# Patient Record
Sex: Female | Born: 1965 | Race: White | Hispanic: No | Marital: Married | Smoking: Never smoker
Health system: Southern US, Community
[De-identification: ages and names within clinical notes are randomized; demographics above are authoritative.]

## PROBLEM LIST (undated history)

## (undated) DIAGNOSIS — E039 Hypothyroidism, unspecified: Secondary | ICD-10-CM

## (undated) DIAGNOSIS — E079 Disorder of thyroid, unspecified: Secondary | ICD-10-CM

## (undated) DIAGNOSIS — F419 Anxiety disorder, unspecified: Secondary | ICD-10-CM

## (undated) HISTORY — PX: CHOLECYSTECTOMY: SHX55

## (undated) HISTORY — DX: Disorder of thyroid, unspecified: E07.9

## (undated) HISTORY — PX: THYROIDECTOMY, PARTIAL: SHX18

## (undated) HISTORY — DX: Anxiety disorder, unspecified: F41.9

---

## 2015-05-20 LAB — HM MAMMOGRAPHY

## 2015-12-09 DIAGNOSIS — G4700 Insomnia, unspecified: Secondary | ICD-10-CM | POA: Diagnosis not present

## 2015-12-09 DIAGNOSIS — Z1211 Encounter for screening for malignant neoplasm of colon: Secondary | ICD-10-CM | POA: Diagnosis not present

## 2015-12-09 DIAGNOSIS — F419 Anxiety disorder, unspecified: Secondary | ICD-10-CM | POA: Diagnosis not present

## 2015-12-09 DIAGNOSIS — E039 Hypothyroidism, unspecified: Secondary | ICD-10-CM | POA: Diagnosis not present

## 2015-12-20 DIAGNOSIS — M25511 Pain in right shoulder: Secondary | ICD-10-CM | POA: Diagnosis not present

## 2015-12-20 DIAGNOSIS — G8929 Other chronic pain: Secondary | ICD-10-CM | POA: Diagnosis not present

## 2015-12-20 DIAGNOSIS — Z6831 Body mass index (BMI) 31.0-31.9, adult: Secondary | ICD-10-CM | POA: Diagnosis not present

## 2015-12-26 DIAGNOSIS — M7541 Impingement syndrome of right shoulder: Secondary | ICD-10-CM | POA: Diagnosis not present

## 2016-01-20 DIAGNOSIS — M7541 Impingement syndrome of right shoulder: Secondary | ICD-10-CM | POA: Diagnosis not present

## 2016-01-20 DIAGNOSIS — M25511 Pain in right shoulder: Secondary | ICD-10-CM | POA: Diagnosis not present

## 2016-01-27 DIAGNOSIS — M25611 Stiffness of right shoulder, not elsewhere classified: Secondary | ICD-10-CM | POA: Diagnosis not present

## 2016-01-27 DIAGNOSIS — M25511 Pain in right shoulder: Secondary | ICD-10-CM | POA: Diagnosis not present

## 2016-01-30 DIAGNOSIS — M7541 Impingement syndrome of right shoulder: Secondary | ICD-10-CM | POA: Diagnosis not present

## 2016-09-16 DIAGNOSIS — M71572 Other bursitis, not elsewhere classified, left ankle and foot: Secondary | ICD-10-CM | POA: Diagnosis not present

## 2016-09-16 DIAGNOSIS — M76822 Posterior tibial tendinitis, left leg: Secondary | ICD-10-CM | POA: Diagnosis not present

## 2016-09-16 DIAGNOSIS — M71571 Other bursitis, not elsewhere classified, right ankle and foot: Secondary | ICD-10-CM | POA: Diagnosis not present

## 2016-09-16 DIAGNOSIS — M722 Plantar fascial fibromatosis: Secondary | ICD-10-CM | POA: Diagnosis not present

## 2016-09-16 DIAGNOSIS — M76821 Posterior tibial tendinitis, right leg: Secondary | ICD-10-CM | POA: Diagnosis not present

## 2016-09-16 DIAGNOSIS — M7731 Calcaneal spur, right foot: Secondary | ICD-10-CM | POA: Diagnosis not present

## 2016-09-16 DIAGNOSIS — M7732 Calcaneal spur, left foot: Secondary | ICD-10-CM | POA: Diagnosis not present

## 2016-09-22 DIAGNOSIS — M71572 Other bursitis, not elsewhere classified, left ankle and foot: Secondary | ICD-10-CM | POA: Diagnosis not present

## 2016-09-22 DIAGNOSIS — M722 Plantar fascial fibromatosis: Secondary | ICD-10-CM | POA: Diagnosis not present

## 2016-09-22 DIAGNOSIS — M71571 Other bursitis, not elsewhere classified, right ankle and foot: Secondary | ICD-10-CM | POA: Diagnosis not present

## 2016-10-30 DIAGNOSIS — Z0289 Encounter for other administrative examinations: Secondary | ICD-10-CM | POA: Diagnosis not present

## 2016-10-30 DIAGNOSIS — E039 Hypothyroidism, unspecified: Secondary | ICD-10-CM | POA: Diagnosis not present

## 2016-10-30 DIAGNOSIS — Z79891 Long term (current) use of opiate analgesic: Secondary | ICD-10-CM | POA: Diagnosis not present

## 2016-10-30 DIAGNOSIS — F5101 Primary insomnia: Secondary | ICD-10-CM | POA: Diagnosis not present

## 2016-10-30 DIAGNOSIS — Z5181 Encounter for therapeutic drug level monitoring: Secondary | ICD-10-CM | POA: Diagnosis not present

## 2016-10-30 DIAGNOSIS — Z6831 Body mass index (BMI) 31.0-31.9, adult: Secondary | ICD-10-CM | POA: Diagnosis not present

## 2016-10-30 DIAGNOSIS — F419 Anxiety disorder, unspecified: Secondary | ICD-10-CM | POA: Diagnosis not present

## 2016-12-07 ENCOUNTER — Emergency Department
Admission: EM | Admit: 2016-12-07 | Discharge: 2016-12-07 | Disposition: A | Discharge status: Home / Self Care | Payer: BLUE CROSS/BLUE SHIELD | Attending: Emergency Medicine | Admitting: Emergency Medicine

## 2016-12-07 ENCOUNTER — Emergency Department: Payer: BLUE CROSS/BLUE SHIELD

## 2016-12-07 ENCOUNTER — Encounter: Payer: Self-pay | Admitting: *Deleted

## 2016-12-07 DIAGNOSIS — S3992XA Unspecified injury of lower back, initial encounter: Secondary | ICD-10-CM | POA: Diagnosis not present

## 2016-12-07 DIAGNOSIS — S39012A Strain of muscle, fascia and tendon of lower back, initial encounter: Secondary | ICD-10-CM | POA: Diagnosis not present

## 2016-12-07 DIAGNOSIS — Y939 Activity, unspecified: Secondary | ICD-10-CM | POA: Diagnosis not present

## 2016-12-07 DIAGNOSIS — Y999 Unspecified external cause status: Secondary | ICD-10-CM | POA: Diagnosis not present

## 2016-12-07 DIAGNOSIS — W010XXA Fall on same level from slipping, tripping and stumbling without subsequent striking against object, initial encounter: Secondary | ICD-10-CM | POA: Diagnosis not present

## 2016-12-07 DIAGNOSIS — Y929 Unspecified place or not applicable: Secondary | ICD-10-CM | POA: Diagnosis not present

## 2016-12-07 DIAGNOSIS — M545 Low back pain: Secondary | ICD-10-CM | POA: Diagnosis not present

## 2016-12-07 MED ORDER — TRAMADOL HCL 50 MG PO TABS: 50.0000 mg | ORAL_TABLET | Freq: Four times a day (QID) | ORAL | 0 refills | Status: DC | PRN

## 2016-12-07 MED ORDER — HYDROMORPHONE HCL 1 MG/ML IJ SOLN
1.0000 mg | Freq: Once | INTRAMUSCULAR | Status: AC
  Administered 2016-12-07: 1 mg via INTRAMUSCULAR
  Filled 2016-12-07: qty 1

## 2016-12-07 MED ORDER — CYCLOBENZAPRINE HCL 10 MG PO TABS: 10.0000 mg | ORAL_TABLET | Freq: Three times a day (TID) | ORAL | 0 refills | Status: DC | PRN

## 2016-12-07 MED ORDER — ORPHENADRINE CITRATE 30 MG/ML IJ SOLN
60.0000 mg | Freq: Two times a day (BID) | INTRAMUSCULAR | Status: DC
  Administered 2016-12-07: 60 mg via INTRAMUSCULAR
  Filled 2016-12-07: qty 2

## 2016-12-07 NOTE — ED Triage Notes (Signed)
Pt states lower back pain Since Friday, denies any urinary symptoms, states sitting makes the pain worse

## 2016-12-07 NOTE — ED Provider Notes (Signed)
[facility] Emergency Department Provider Note   ____________________________________________   First MD Initiated Contact with Patient 12/07/16 [phone]     (approximate)  I have reviewed the triage vital signs and the nursing notes.   HISTORY  Chief Complaint Back Pain    HPI Elizabeth Fuller is a 51 y.o. female patient complaining of 3 days of low back pain. Patient stated onset she misstepped on the pavement. Patient stated pain was intermittently notably cannabis worsen when she went to work this morning. Patient denies radicular component to her pain. Patient denies bladder or bowel dysfunction..Patient rates the pain as a 10 over 10. Patient had a pain as "achy". Mild relief with over-the-counter anti-inflammatory medications.   History reviewed. No pertinent past medical history.  There are no active problems to display for this patient.   History reviewed. No pertinent surgical history.  Prior to Admission medications   Medication Sig Start Date End Date Taking? Authorizing Provider  cyclobenzaprine (FLEXERIL) 10 MG tablet Take 1 tablet (10 mg total) by mouth 3 (three) times daily as needed. 12/07/16   Sable Feil, PA-C  traMADol (ULTRAM) 50 MG tablet Take 1 tablet (50 mg total) by mouth every 6 (six) hours as needed for moderate pain. 12/07/16   Sable Feil, PA-C    Allergies Patient has no known allergies.  History reviewed. No pertinent family history.  Social History Social History  Substance Use Topics  . Smoking status: Unknown If Ever Smoked  . Smokeless tobacco: Never Used  . Alcohol use No    Review of Systems  Constitutional: No fever/chills Eyes: No visual changes. ENT: No sore throat. Cardiovascular: Denies chest pain. Respiratory: Denies shortness of breath. Gastrointestinal: No abdominal pain.  No nausea, no vomiting.  No diarrhea.  No constipation. Genitourinary: Negative for dysuria. Musculoskeletal: Positive  for back pain. Skin: Negative for rash. Neurological: Negative for headaches, focal weakness or numbness.   ____________________________________________   PHYSICAL EXAM:  VITAL SIGNS: ED Triage Vitals  Enc Vitals Group     BP 12/07/16 0923 (!) 152/119     Pulse Rate 12/07/16 0923 (!) 108     Resp 12/07/16 0923 (!) 26     Temp --      Temp src --      SpO2 12/07/16 0923 100 %     Weight 12/07/16 0924 180 lb (81.6 kg)     Height 12/07/16 0924 5\' 4"  (1.626 m)     Head Circumference --      Peak Flow --      Pain Score 12/07/16 0924 10     Pain Loc --      Pain Edu? --      Excl. in [place]? --     Constitutional: Alert and oriented. Moderate distress Neck: No stridor.  No cervical spine tenderness to palpation. Hematological/Lymphatic/Immunilogical: No cervical lymphadenopathy. Cardiovascular: Normal rate, regular rhythm. Grossly normal heart sounds.  Good peripheral circulation. Respiratory: Normal respiratory effort.  No retractions. Lungs CTAB. Gastrointestinal: Soft and nontender. No distention. No abdominal bruits. No CVA tenderness. Musculoskeletal: No obvious spinal deformity. Guarding with palpation of L4-S1 Neurologic:  Normal speech and language. No gross focal neurologic deficits are appreciated. No gait instability. Skin:  Skin is warm, dry and intact. No rash noted. Psychiatric: Mood and affect are normal. Speech and behavior are normal.  ____________________________________________   LABS (all labs ordered are listed, but only abnormal results are displayed)  Labs Reviewed - No data  to display ____________________________________________  EKG   ____________________________________________  RADIOLOGY  Dg Lumbar Spine Complete  Result Date: 12/07/2016 CLINICAL DATA:  51 year old female with a history of left-sided lumbar back pain EXAM: LUMBAR SPINE - COMPLETE 4+ VIEW COMPARISON:  None. FINDINGS: Lumbar Spine: Lumbar vertebral elements maintain normal  alignment without evidence of anterolisthesis, retrolisthesis, subluxation. No fracture line identified. Vertebral body heights maintained as well as disc space heights. No significant degenerative disc disease or endplate changes. No significant facet changes. Surgical changes of prior cholecystectomy. Oblique images demonstrate no displaced pars defect IMPRESSION: Negative for acute fracture or malalignment of the lumbar spine. Electronically Signed   By: Corrie Mckusick D.O.   On: 12/07/2016 11:01    ____________________________________________   PROCEDURES  Procedure(s) performed: None  Procedures  Critical Care performed: No  ____________________________________________   INITIAL IMPRESSION / ASSESSMENT AND PLAN / ED COURSE  Pertinent labs & imaging results that were available during my care of the patient were reviewed by me and considered in my medical decision making (see chart for details).  Back pain secondary to lumbosacral strain. Discussed negative x-ray findings of the lumbar spine with patient. Patient given discharge care instructions and a work note. Patient advised take medication as directed. Patient denies follow her PCP if no improvement in 3 days.      ____________________________________________   FINAL CLINICAL IMPRESSION(S) / ED DIAGNOSES  Final diagnoses:  Strain of lumbar region, initial encounter      NEW MEDICATIONS STARTED DURING THIS VISIT:  New Prescriptions   CYCLOBENZAPRINE (FLEXERIL) 10 MG TABLET    Take 1 tablet (10 mg total) by mouth 3 (three) times daily as needed.   TRAMADOL (ULTRAM) 50 MG TABLET    Take 1 tablet (50 mg total) by mouth every 6 (six) hours as needed for moderate pain.     Note:  This document was prepared using Dragon voice recognition software and may include unintentional dictation errors.    Sable Feil, PA-C 12/07/16 1134    Lavonia Drafts, MD 12/07/16 [phone]

## 2017-02-22 DIAGNOSIS — M722 Plantar fascial fibromatosis: Secondary | ICD-10-CM | POA: Diagnosis not present

## 2017-02-22 DIAGNOSIS — M71572 Other bursitis, not elsewhere classified, left ankle and foot: Secondary | ICD-10-CM | POA: Diagnosis not present

## 2017-02-22 DIAGNOSIS — M71571 Other bursitis, not elsewhere classified, right ankle and foot: Secondary | ICD-10-CM | POA: Diagnosis not present

## 2017-04-28 DIAGNOSIS — Z23 Encounter for immunization: Secondary | ICD-10-CM | POA: Diagnosis not present

## 2017-04-28 DIAGNOSIS — F5101 Primary insomnia: Secondary | ICD-10-CM | POA: Diagnosis not present

## 2017-04-28 DIAGNOSIS — E039 Hypothyroidism, unspecified: Secondary | ICD-10-CM | POA: Diagnosis not present

## 2017-04-28 DIAGNOSIS — F419 Anxiety disorder, unspecified: Secondary | ICD-10-CM | POA: Diagnosis not present

## 2017-04-28 LAB — TSH: TSH: 0.78 (ref <=5.90)

## 2017-06-01 ENCOUNTER — Encounter: Payer: Self-pay | Admitting: Family Medicine

## 2017-06-01 ENCOUNTER — Ambulatory Visit: Payer: BLUE CROSS/BLUE SHIELD | Admitting: Family Medicine

## 2017-06-01 VITALS — BP 120/76 | HR 74 | Temp 98.4°F | Resp 16 | Ht 64.0 in | Wt 182.0 lb

## 2017-06-01 DIAGNOSIS — Z124 Encounter for screening for malignant neoplasm of cervix: Secondary | ICD-10-CM | POA: Diagnosis not present

## 2017-06-01 DIAGNOSIS — E669 Obesity, unspecified: Secondary | ICD-10-CM

## 2017-06-01 DIAGNOSIS — Z6831 Body mass index (BMI) 31.0-31.9, adult: Secondary | ICD-10-CM

## 2017-06-01 DIAGNOSIS — F419 Anxiety disorder, unspecified: Secondary | ICD-10-CM

## 2017-06-01 DIAGNOSIS — Z114 Encounter for screening for human immunodeficiency virus [HIV]: Secondary | ICD-10-CM

## 2017-06-01 DIAGNOSIS — Z1211 Encounter for screening for malignant neoplasm of colon: Secondary | ICD-10-CM

## 2017-06-01 DIAGNOSIS — Z1159 Encounter for screening for other viral diseases: Secondary | ICD-10-CM | POA: Diagnosis not present

## 2017-06-01 DIAGNOSIS — Z1231 Encounter for screening mammogram for malignant neoplasm of breast: Secondary | ICD-10-CM

## 2017-06-01 DIAGNOSIS — G4700 Insomnia, unspecified: Secondary | ICD-10-CM | POA: Diagnosis not present

## 2017-06-01 DIAGNOSIS — Z0000 Encounter for general adult medical examination without abnormal findings: Secondary | ICD-10-CM

## 2017-06-01 MED ORDER — ESCITALOPRAM OXALATE 10 MG PO TABS: 10.0000 mg | ORAL_TABLET | Freq: Every day | ORAL | 1 refills | Status: DC

## 2017-06-01 NOTE — Assessment & Plan Note (Signed)
Stable currently, but discussed importance of SSRI and therapy as opposed to Benzo use Patient agrees to try Lexapro 10mg  daily Will f/u in 1 month and consider dose titration

## 2017-06-01 NOTE — Assessment & Plan Note (Signed)
Patient is stable on Ambien for several years Will continue, but discussed not mixing with other sedatives

## 2017-06-01 NOTE — Progress Notes (Signed)
Patient: Elizabeth Fuller, Female    DOB: 20-Jun-1965, 52 y.o.   MRN: [id number] Visit Date: 06/01/2017  Today's Provider: Lavon Paganini, MD   I, Martha Clan, CMA, am acting as scribe for Lavon Paganini, MD.  Chief Complaint  Patient presents with  . Establish Care   Subjective:    Establish Care Elizabeth Fuller is a 52 y.o. female who presents today for health maintenance and to establish care. She feels fairly well. She is c/o right foot pain. She reports exercising none. She reports she is sleeping fairly well, with Ambien use.  Her PMH includes anxiety, insomnia and a partial thyroidectomy secondary to nodule.  Her last mammogram was 05/21/2015- BI-RADS 1 Last pap- 09/21/2012- NIL; HPV negative. Never had colonoscopy/other colon cancer screenings ----------------------------------------------------------------- Anxiety: Feels it is well controlled Taking 1 Half tab 3 times weekly of Xanax Was on Zoloft years ago, but no SSRI since that time Has been taking Xanax for years Feels very tired after taking Xanax, especially with Ambien  Insomnia: Taking Ambien 5mg  qhs for years  Review of Systems  Constitutional: Negative.   HENT: Negative.   Eyes: Negative.   Respiratory: Negative.   Cardiovascular: Negative.   Gastrointestinal: Negative.   Endocrine: Negative.   Genitourinary: Negative.   Musculoskeletal: Negative.   Skin: Negative.   Allergic/Immunologic: Negative.   Neurological: Negative.   Hematological: Negative.   Psychiatric/Behavioral: Negative.     Social History      She  reports that  has never smoked. she has never used smokeless tobacco. She reports that she does not drink alcohol or use drugs.       Social History   Socioeconomic History  . Marital status: Married    Spouse name: Mortimer Fries  . Number of children: 0  . Years of education: 61  . Highest education level: 11th grade  Social Needs  . Financial resource strain: Not hard at  all  . Food insecurity - worry: Never true  . Food insecurity - inability: Never true  . Transportation needs - medical: No  . Transportation needs - non-medical: No  Occupational History    Employer: [employer]  Tobacco Use  . Smoking status: Never Smoker  . Smokeless tobacco: Never Used  Substance and Sexual Activity  . Alcohol use: No    Comment: 1 drink every 6 months  . Drug use: No  . Sexual activity: Yes    Birth control/protection: Post-menopausal  Other Topics Concern  . None  Social History Narrative  . None    Past Medical History:  Diagnosis Date  . Anxiety   . Thyroid disease      There are no active problems to display for this patient.   Past Surgical History:  Procedure Laterality Date  . CHOLECYSTECTOMY    . THYROIDECTOMY, PARTIAL Left     Family History        Family Status  Relation Name Status  . Mother  Alive  . Father  Deceased  . Brother  Alive  . Neg Hx  (Not Specified)        Her family history includes Alzheimer's disease in her mother; Diabetes in her father; Heart disease in her brother and father; Hypertension in her brother and mother. There is no history of Breast cancer, Colon cancer, Cervical cancer, or Ovarian cancer.      No Known Allergies   Current Outpatient Medications:  .  ALPRAZolam (XANAX XR) 0.5 MG 24 hr  tablet, Take 0.5 mg by mouth daily as needed., Disp: , Rfl:  .  zolpidem (AMBIEN) 10 MG tablet, Take 5 mg by mouth at bedtime., Disp: , Rfl:  .  levothyroxine (SYNTHROID, LEVOTHROID) 75 MCG tablet, Take 1 tablet by mouth daily., Disp: , Rfl:    Patient Care Team: Virginia Crews, MD as PCP - General (Family Medicine)      Objective:   Vitals: BP 120/76 (BP Location: Left Arm, Patient Position: Sitting, Cuff Size: Large)   Pulse 74   Temp 98.4 F (36.9 C) (Oral)   Resp 16   Ht 5\' 4"  (1.626 m)   Wt 182 lb (82.6 kg)   SpO2 97%   BMI 31.24 kg/m    Vitals:   06/01/17 0912  BP: 120/76  Pulse: 74    Resp: 16  Temp: 98.4 F (36.9 C)  TempSrc: Oral  SpO2: 97%  Weight: 182 lb (82.6 kg)  Height: 5\' 4"  (1.626 m)     Physical Exam  Constitutional: She is oriented to person, place, and time. She appears well-developed and well-nourished. No distress.  HENT:  Head: Normocephalic and atraumatic.  Right Ear: External ear normal.  Left Ear: External ear normal.  Nose: Nose normal.  Mouth/Throat: Oropharynx is clear and moist.  Eyes: Conjunctivae and EOM are normal. Pupils are equal, round, and reactive to light. No scleral icterus.  Neck: Neck supple. No thyromegaly present.  Cardiovascular: Normal rate, regular rhythm, normal heart sounds and intact distal pulses.  No murmur heard. Pulmonary/Chest: Effort normal and breath sounds normal. No respiratory distress. She has no wheezes. She has no rales.  Abdominal: Soft. Bowel sounds are normal. She exhibits no distension. There is no tenderness. There is no rebound and no guarding.  Genitourinary:  Genitourinary Comments: Breasts: breasts appear normal, no suspicious masses, no skin or nipple changes or axillary nodes. GYN:  External genitalia within normal limits.  Vaginal mucosa pink, moist, normal rugae.  Nonfriable cervix without lesions, no discharge or bleeding noted on speculum exam.  Bimanual exam revealed normal, nongravid uterus.  No cervical motion tenderness. No adnexal masses bilaterally.     Musculoskeletal: She exhibits edema (trace). She exhibits no deformity.  Lymphadenopathy:    She has no cervical adenopathy.  Neurological: She is alert and oriented to person, place, and time.  Skin: Skin is warm and dry. No rash noted.  Psychiatric: She has a normal mood and affect. Her behavior is normal.  Vitals reviewed.    Depression Screen PHQ 2/9 Scores 06/01/2017  PHQ - 2 Score 0    Assessment & Plan:     Routine Health Maintenance and Physical Exam  Exercise Activities and Dietary recommendations Goals    None       There is no immunization history on file for this patient.  Health Maintenance  Topic Date Due  . Hepatitis C Screening  Aug 23, 1965  . HIV Screening  09/01/1978  . TETANUS/TDAP  09/01/1982  . PAP SMEAR  08/31/1984  . MAMMOGRAM  08/31/2013  . COLONOSCOPY  08/31/2013  . INFLUENZA VACCINE  10/28/2016     Discussed health benefits of physical activity, and encouraged her to engage in regular exercise appropriate for her age and condition.    -------------------------------------------------------------------- Problem List Items Addressed This Visit      Other   Anxiety    Stable currently, but discussed importance of SSRI and therapy as opposed to Benzo use Patient agrees to try Lexapro 10mg  daily Will f/u  in 1 month and consider dose titration      Relevant Medications   ALPRAZolam (XANAX XR) 0.5 MG 24 hr tablet   escitalopram (LEXAPRO) 10 MG tablet   Insomnia    Patient is stable on Ambien for several years Will continue, but discussed not mixing with other sedatives       Other Visit Diagnoses    Encounter for annual physical exam    -  Primary   Relevant Orders   Lipid panel   Comprehensive metabolic panel   Colon cancer screening       Relevant Orders   Ambulatory referral to Gastroenterology   Screening for HIV (human immunodeficiency virus)       Relevant Orders   HIV antibody (with reflex)   Need for hepatitis C screening test       Relevant Orders   Hepatitis C Antibody   Class 1 obesity without serious comorbidity with body mass index (BMI) of 31.0 to 31.9 in adult, unspecified obesity type       Relevant Orders   Lipid panel   Comprehensive metabolic panel   Encounter for screening mammogram for breast cancer       Relevant Orders   MM SCREENING BREAST TOMO BILATERAL   Cervical cancer screening       Relevant Orders   Pap IG and HPV (high risk) DNA detection       Return in about 4 weeks (around 06/29/2017) for anxiety f/u.   The entirety  of the information documented in the History of Present Illness, Review of Systems and Physical Exam were personally obtained by me. Portions of this information were initially documented by Raquel Sarna Ratchford, CMA and reviewed by me for thoroughness and accuracy.    Virginia Crews, MD, MPH [facility] 06/01/2017 10:32 AM

## 2017-06-01 NOTE — Patient Instructions (Signed)
Preventive Care 40-64 Years, Female Preventive care refers to lifestyle choices and visits with your health care provider that can promote health and wellness. What does preventive care include?  A yearly physical exam. This is also called an annual well check.  Dental exams once or twice a year.  Routine eye exams. Ask your health care provider how often you should have your eyes checked.  Personal lifestyle choices, including: ? Daily care of your teeth and gums. ? Regular physical activity. ? Eating a healthy diet. ? Avoiding tobacco and drug use. ? Limiting alcohol use. ? Practicing safe sex. ? Taking low-dose aspirin daily starting at age 58. ? Taking vitamin and mineral supplements as recommended by your health care provider. What happens during an annual well check? The services and screenings done by your health care provider during your annual well check will depend on your age, overall health, lifestyle risk factors, and family history of disease. Counseling Your health care provider may ask you questions about your:  Alcohol use.  Tobacco use.  Drug use.  Emotional well-being.  Home and relationship well-being.  Sexual activity.  Eating habits.  Work and work Statistician.  Method of birth control.  Menstrual cycle.  Pregnancy history.  Screening You may have the following tests or measurements:  Height, weight, and BMI.  Blood pressure.  Lipid and cholesterol levels. These may be checked every 5 years, or more frequently if you are over 81 years old.  Skin check.  Lung cancer screening. You may have this screening every year starting at age 78 if you have a 30-pack-year history of smoking and currently smoke or have quit within the past 15 years.  Fecal occult blood test (FOBT) of the stool. You may have this test every year starting at age 65.  Flexible sigmoidoscopy or colonoscopy. You may have a sigmoidoscopy every 5 years or a colonoscopy  every 10 years starting at age 30.  Hepatitis C blood test.  Hepatitis B blood test.  Sexually transmitted disease (STD) testing.  Diabetes screening. This is done by checking your blood sugar (glucose) after you have not eaten for a while (fasting). You may have this done every 1-3 years.  Mammogram. This may be done every 1-2 years. Talk to your health care provider about when you should start having regular mammograms. This may depend on whether you have a family history of breast cancer.  BRCA-related cancer screening. This may be done if you have a family history of breast, ovarian, tubal, or peritoneal cancers.  Pelvic exam and Pap test. This may be done every 3 years starting at age 80. Starting at age 36, this may be done every 5 years if you have a Pap test in combination with an HPV test.  Bone density scan. This is done to screen for osteoporosis. You may have this scan if you are at high risk for osteoporosis.  Discuss your test results, treatment options, and if necessary, the need for more tests with your health care provider. Vaccines Your health care provider may recommend certain vaccines, such as:  Influenza vaccine. This is recommended every year.  Tetanus, diphtheria, and acellular pertussis (Tdap, Td) vaccine. You may need a Td booster every 10 years.  Varicella vaccine. You may need this if you have not been vaccinated.  Zoster vaccine. You may need this after age 5.  Measles, mumps, and rubella (MMR) vaccine. You may need at least one dose of MMR if you were born in  1957 or later. You may also need a second dose.  Pneumococcal 13-valent conjugate (PCV13) vaccine. You may need this if you have certain conditions and were not previously vaccinated.  Pneumococcal polysaccharide (PPSV23) vaccine. You may need one or two doses if you smoke cigarettes or if you have certain conditions.  Meningococcal vaccine. You may need this if you have certain  conditions.  Hepatitis A vaccine. You may need this if you have certain conditions or if you travel or work in places where you may be exposed to hepatitis A.  Hepatitis B vaccine. You may need this if you have certain conditions or if you travel or work in places where you may be exposed to hepatitis B.  Haemophilus influenzae type b (Hib) vaccine. You may need this if you have certain conditions.  Talk to your health care provider about which screenings and vaccines you need and how often you need them. This information is not intended to replace advice given to you by your health care provider. Make sure you discuss any questions you have with your health care provider. Document Released: 04/12/2015 Document Revised: 12/04/2015 Document Reviewed: 01/15/2015 Elsevier Interactive Patient Education  2018 Elsevier Inc.  

## 2017-06-02 DIAGNOSIS — Z0000 Encounter for general adult medical examination without abnormal findings: Secondary | ICD-10-CM | POA: Diagnosis not present

## 2017-06-02 DIAGNOSIS — Z1159 Encounter for screening for other viral diseases: Secondary | ICD-10-CM | POA: Diagnosis not present

## 2017-06-02 DIAGNOSIS — E669 Obesity, unspecified: Secondary | ICD-10-CM | POA: Diagnosis not present

## 2017-06-02 DIAGNOSIS — Z6831 Body mass index (BMI) 31.0-31.9, adult: Secondary | ICD-10-CM | POA: Diagnosis not present

## 2017-06-03 ENCOUNTER — Telehealth: Payer: Self-pay

## 2017-06-03 LAB — COMPREHENSIVE METABOLIC PANEL
ALT: 22 IU/L (ref 0–32)
AST: 20 IU/L (ref 0–40)
Albumin/Globulin Ratio: 2.5 — ABNORMAL HIGH (ref 1.2–2.2)
Albumin: 4.5 g/dL (ref 3.5–5.5)
Alkaline Phosphatase: 76 IU/L (ref 39–117)
BUN/Creatinine Ratio: 9 (ref 9–23)
BUN: 8 mg/dL (ref 6–24)
Bilirubin Total: 0.4 mg/dL (ref 0.0–1.2)
CO2: 26 mmol/L (ref 20–29)
Calcium: 9.5 mg/dL (ref 8.7–10.2)
Chloride: 102 mmol/L (ref 96–106)
Creatinine, Ser: 0.93 mg/dL (ref 0.57–1.00)
GFR calc Af Amer: 81 mL/min/{1.73_m2} (ref >59)
GFR calc non Af Amer: 70 mL/min/{1.73_m2} (ref >59)
Globulin, Total: 1.8 g/dL (ref 1.5–4.5)
Glucose: 94 mg/dL (ref 65–99)
Potassium: 4.3 mmol/L (ref 3.5–5.2)
Sodium: 142 mmol/L (ref 134–144)
Total Protein: 6.3 g/dL (ref 6.0–8.5)

## 2017-06-03 LAB — LIPID PANEL
Chol/HDL Ratio: 3.1 ratio (ref 0.0–4.4)
Cholesterol, Total: 219 mg/dL — ABNORMAL HIGH (ref 100–199)
HDL: 70 mg/dL (ref >39)
LDL Calculated: 130 mg/dL — ABNORMAL HIGH (ref 0–99)
Triglycerides: 93 mg/dL (ref 0–149)
VLDL Cholesterol Cal: 19 mg/dL (ref 5–40)

## 2017-06-03 LAB — PAP IG AND HPV HIGH-RISK
HPV, high-risk: NEGATIVE
PAP Smear Comment: 0

## 2017-06-03 LAB — HIV ANTIBODY (ROUTINE TESTING W REFLEX): HIV Screen 4th Generation wRfx: NONREACTIVE

## 2017-06-03 LAB — HEPATITIS C ANTIBODY: Hep C Virus Ab: <0.1 s/co ratio (ref 0.0–0.9)

## 2017-06-03 NOTE — Telephone Encounter (Signed)
Pt advised and expresses understanding.

## 2017-06-03 NOTE — Telephone Encounter (Signed)
-----   Message from Virginia Crews, MD sent at 06/03/2017 12:10 PM EST ----- Cholesterol is high, but 10 year risk of heart disease/stroke is low at 1.2%.  No need for medications at this time.  Normal kidney function, liver function, electrolytes, blood sugar.  Negative hepatitis C and HIV screenings.  Pap smear is pending and we will call with this result separately.  Virginia Crews, MD, MPH [facility] 06/03/2017 12:10 PM

## 2017-06-04 ENCOUNTER — Telehealth: Payer: Self-pay

## 2017-06-04 ENCOUNTER — Other Ambulatory Visit: Payer: Self-pay

## 2017-06-04 DIAGNOSIS — Z1211 Encounter for screening for malignant neoplasm of colon: Secondary | ICD-10-CM

## 2017-06-04 NOTE — Telephone Encounter (Signed)
LMTCB. Pt has been advised of other results and needs pap results only.

## 2017-06-04 NOTE — Telephone Encounter (Signed)
Pt advised.

## 2017-06-04 NOTE — Telephone Encounter (Signed)
-----   Message from Virginia Crews, MD sent at 06/03/2017  6:22 PM EST ----- See other result note as well.  Normal pap smear and HPV negative  Bacigalupo, Dionne Bucy, MD, MPH [facility] 06/03/2017 6:22 PM

## 2017-06-04 NOTE — Telephone Encounter (Signed)
Gastroenterology Pre-Procedure Review  Request Date: 06/14/17 Requesting Physician: Dr. Marius Ditch  PATIENT REVIEW QUESTIONS: The patient responded to the following health history questions as indicated:    1. Are you having any GI issues? no 2. Do you have a personal history of Polyps? no 3. Do you have a family history of Colon Cancer or Polyps? no 4. Diabetes Mellitus? no 5. Joint replacements in the past 12 months?no 6. Major health problems in the past 3 months?no 7. Any artificial heart valves, MVP, or defibrillator?no    MEDICATIONS & ALLERGIES:    Patient reports the following regarding taking any anticoagulation/antiplatelet therapy:   Plavix, Coumadin, Eliquis, Xarelto, Lovenox, Pradaxa, Brilinta, or Effient? no Aspirin? no  Patient confirms/reports the following medications:  Current Outpatient Medications  Medication Sig Dispense Refill  . ALPRAZolam (XANAX XR) 0.5 MG 24 hr tablet Take 0.5 mg by mouth daily as needed.    Marland Kitchen escitalopram (LEXAPRO) 10 MG tablet Take 1 tablet (10 mg total) by mouth daily. 30 tablet 1  . levothyroxine (SYNTHROID, LEVOTHROID) 75 MCG tablet Take 1 tablet by mouth daily.    Marland Kitchen zolpidem (AMBIEN) 10 MG tablet Take 5 mg by mouth at bedtime.     No current facility-administered medications for this visit.     Patient confirms/reports the following allergies:  No Known Allergies  No orders of the defined types were placed in this encounter.   AUTHORIZATION INFORMATION Primary Insurance: 1D#: Group #:  Secondary Insurance: 1D#: Group #:  SCHEDULE INFORMATION: Date: 06/14/17 Time: Location:[facility]

## 2017-06-11 ENCOUNTER — Encounter: Payer: Self-pay | Admitting: Student

## 2017-06-14 ENCOUNTER — Ambulatory Visit
Admission: RE | Admit: 2017-06-14 | Discharge: 2017-06-14 | Disposition: A | Discharge status: Home / Self Care | Payer: BLUE CROSS/BLUE SHIELD | Source: Ambulatory Visit | Attending: Gastroenterology | Admitting: Gastroenterology

## 2017-06-14 ENCOUNTER — Ambulatory Visit: Payer: BLUE CROSS/BLUE SHIELD | Admitting: Certified Registered Nurse Anesthetist

## 2017-06-14 ENCOUNTER — Encounter: Payer: Self-pay | Admitting: *Deleted

## 2017-06-14 ENCOUNTER — Encounter
Admission: RE | Disposition: A | Discharge status: Home / Self Care | Payer: Self-pay | Source: Ambulatory Visit | Attending: Gastroenterology

## 2017-06-14 DIAGNOSIS — Z79890 Hormone replacement therapy (postmenopausal): Secondary | ICD-10-CM | POA: Insufficient documentation

## 2017-06-14 DIAGNOSIS — E890 Postprocedural hypothyroidism: Secondary | ICD-10-CM | POA: Diagnosis not present

## 2017-06-14 DIAGNOSIS — F419 Anxiety disorder, unspecified: Secondary | ICD-10-CM | POA: Diagnosis not present

## 2017-06-14 DIAGNOSIS — D125 Benign neoplasm of sigmoid colon: Secondary | ICD-10-CM

## 2017-06-14 DIAGNOSIS — Z9049 Acquired absence of other specified parts of digestive tract: Secondary | ICD-10-CM | POA: Diagnosis not present

## 2017-06-14 DIAGNOSIS — Z1211 Encounter for screening for malignant neoplasm of colon: Secondary | ICD-10-CM | POA: Diagnosis not present

## 2017-06-14 DIAGNOSIS — D124 Benign neoplasm of descending colon: Secondary | ICD-10-CM | POA: Diagnosis not present

## 2017-06-14 DIAGNOSIS — D123 Benign neoplasm of transverse colon: Secondary | ICD-10-CM

## 2017-06-14 DIAGNOSIS — K635 Polyp of colon: Secondary | ICD-10-CM | POA: Diagnosis not present

## 2017-06-14 DIAGNOSIS — Z79899 Other long term (current) drug therapy: Secondary | ICD-10-CM | POA: Insufficient documentation

## 2017-06-14 HISTORY — PX: COLONOSCOPY WITH PROPOFOL: SHX5780

## 2017-06-14 HISTORY — DX: Hypothyroidism, unspecified: E03.9

## 2017-06-14 SURGERY — COLONOSCOPY WITH PROPOFOL
Anesthesia: General

## 2017-06-14 MED ORDER — PROPOFOL 500 MG/50ML IV EMUL
INTRAVENOUS | Status: AC
Start: 2017-06-14 — End: ?
  Filled 2017-06-14: qty 50

## 2017-06-14 MED ORDER — PROPOFOL 500 MG/50ML IV EMUL
INTRAVENOUS | Status: DC | PRN
  Administered 2017-06-14: 160 ug/kg/min via INTRAVENOUS

## 2017-06-14 MED ORDER — SODIUM CHLORIDE 0.9 % IV SOLN
INTRAVENOUS | Status: DC
  Administered 2017-06-14: 10:00:00 via INTRAVENOUS

## 2017-06-14 MED ORDER — PROPOFOL 10 MG/ML IV BOLUS
INTRAVENOUS | Status: DC | PRN
  Administered 2017-06-14: 70 mg via INTRAVENOUS

## 2017-06-14 NOTE — H&P (Signed)
Cephas Darby, MD [address]  [place]  [place], [place] [address]  Main: [phone]  Fax: [phone] Pager: [phone]  Primary Care Physician:  Virginia Crews, MD Primary Gastroenterologist:  Dr. Cephas Darby  Pre-Procedure History & Physical: HPI:  Elizabeth Fuller is a 52 y.o. female is here for an colonoscopy.   Past Medical History:  Diagnosis Date  . Anxiety   . Hypothyroidism   . Thyroid disease     Past Surgical History:  Procedure Laterality Date  . CHOLECYSTECTOMY    . THYROIDECTOMY, PARTIAL Left     Prior to Admission medications   Medication Sig Start Date End Date Taking? Authorizing Provider  escitalopram (LEXAPRO) 10 MG tablet Take 1 tablet (10 mg total) by mouth daily. 06/01/17  Yes Bacigalupo, Dionne Bucy, MD  levothyroxine (SYNTHROID, LEVOTHROID) 75 MCG tablet Take 1 tablet by mouth daily. 05/25/17  Yes [provider]  zolpidem (AMBIEN) 10 MG tablet Take 5 mg by mouth at bedtime. 04/28/17  Yes [provider]  ALPRAZolam (XANAX XR) 0.5 MG 24 hr tablet Take 0.5 mg by mouth daily as needed. 04/28/17   [provider]    Allergies as of 06/07/2017  . (No Known Allergies)    Family History  Problem Relation Age of Onset  . Hypertension Mother   . Alzheimer's disease Mother   . Diabetes Father   . Heart disease Father 72  . Heart disease Brother 73  . Hypertension Brother   . Breast cancer Neg Hx   . Colon cancer Neg Hx   . Cervical cancer Neg Hx   . Ovarian cancer Neg Hx     Social History   Socioeconomic History  . Marital status: Married    Spouse name: Mortimer Fries  . Number of children: 0  . Years of education: 73  . Highest education level: 11th grade  Social Needs  . Financial resource strain: Not hard at all  . Food insecurity - worry: Never true  . Food insecurity - inability: Never true  . Transportation needs - medical: No  . Transportation needs - non-medical: No  Occupational History   Employer: [employer]  Tobacco Use  . Smoking status: Never Smoker  . Smokeless tobacco: Never Used  Substance and Sexual Activity  . Alcohol use: No    Comment: 1 drink every 6 months  . Drug use: No  . Sexual activity: Yes    Partners: Male    Birth control/protection: Post-menopausal  Other Topics Concern  . Not on file  Social History Narrative  . Not on file    Review of Systems: See HPI, otherwise negative ROS  Physical Exam: BP 127/89   Pulse 70   Temp (!) 97.4 F (36.3 C) (Tympanic)   Resp 14   Ht 5\' 4"  (1.626 m)   Wt 83 kg (183 lb)   SpO2 100%   BMI 31.41 kg/m  General:   Alert,  pleasant and cooperative in NAD Head:  Normocephalic and atraumatic. Neck:  Supple; no masses or thyromegaly. Lungs:  Clear throughout to auscultation.    Heart:  Regular rate and rhythm. Abdomen:  Soft, nontender and nondistended. Normal bowel sounds, without guarding, and without rebound.   Neurologic:  Alert and  oriented x4;  grossly normal neurologically.  Impression/Plan: Elizabeth Fuller is here for an colonoscopy to be performed for colon cancer screening  Risks, benefits, limitations, and alternatives regarding  colonoscopy have been reviewed with the patient.  Questions  have been answered.  All parties agreeable.   Sherri Sear, MD  06/14/2017, 10:18 AM

## 2017-06-14 NOTE — Anesthesia Procedure Notes (Signed)
Performed by: Demetrius Charity, CRNA Pre-anesthesia Checklist: Patient identified, Emergency Drugs available, Suction available, Patient being monitored and Timeout performed Oxygen Delivery Method: Nasal cannula Preoxygenation: Pre-oxygenation with 100% oxygen Induction Type: IV induction

## 2017-06-14 NOTE — Op Note (Signed)
[facility] Patient Name: Elizabeth Fuller Procedure Date: 06/14/2017 10:23 AM MRN: [id number] Account #: [id number] Date of Birth: 1965/12/20 Admit Type: Outpatient Age: 52 Room: [facility] ENDO ROOM 3 Gender: Female Note Status: Finalized Procedure:            Colonoscopy Indications:          Screening for colorectal malignant neoplasm, This is                        the patient's first colonoscopy Providers:            Lin Landsman MD, MD Referring MD:         Dionne Bucy. Bacigalupo (Referring MD) Medicines:            Monitored Anesthesia Care Complications:        No immediate complications. Estimated blood loss: None. Procedure:            Pre-Anesthesia Assessment:                       - Prior to the procedure, a History and Physical was                        performed, and patient medications and allergies were                        reviewed. The patient is competent. The risks and                        benefits of the procedure and the sedation options and                        risks were discussed with the patient. All questions                        were answered and informed consent was obtained.                        Patient identification and proposed procedure were                        verified by the physician, the nurse, the                        anesthesiologist, the anesthetist and the technician in                        the pre-procedure area in the procedure room in the                        endoscopy suite. Mental Status Examination: alert and                        oriented. Airway Examination: normal oropharyngeal                        airway and neck mobility. Respiratory Examination:                        clear to auscultation. CV Examination: normal.  Prophylactic Antibiotics: The patient does not require                        prophylactic antibiotics. Prior Anticoagulants: The               patient has taken no previous anticoagulant or                        antiplatelet agents. ASA Grade Assessment: II - A                        patient with mild systemic disease. After reviewing the                        risks and benefits, the patient was deemed in                        satisfactory condition to undergo the procedure. The                        anesthesia plan was to use monitored anesthesia care                        (MAC). Immediately prior to administration of                        medications, the patient was re-assessed for adequacy                        to receive sedatives. The heart rate, respiratory rate,                        oxygen saturations, blood pressure, adequacy of                        pulmonary ventilation, and response to care were                        monitored throughout the procedure. The physical status                        of the patient was re-assessed after the procedure.                       After obtaining informed consent, the colonoscope was                        passed under direct vision. Throughout the procedure,                        the patient's blood pressure, pulse, and oxygen                        saturations were monitored continuously. The                        Colonoscope was introduced through the anus and                        advanced to the the terminal ileum. The colonoscopy was  performed without difficulty. The patient tolerated the                        procedure well. The quality of the bowel preparation                        was evaluated using the BBPS [facility] Bowel Preparation                        Scale) with scores of: Right Colon = 3, Transverse                        Colon = 3 and Left Colon = 3 (entire mucosa seen well                        with no residual staining, small fragments of stool or                        opaque liquid). The total BBPS score equals  9. Findings:      The perianal and digital rectal examinations were normal. Pertinent       negatives include normal sphincter tone and no palpable rectal lesions.      The terminal ileum appeared normal.      Three sessile polyps were found in the sigmoid colon and transverse       colon. The polyps were 6 to 8 mm in size. These polyps were removed with       a hot snare. Resection and retrieval were complete.      The retroflexed view of the distal rectum and anal verge was normal and       showed no anal or rectal abnormalities.      The exam was otherwise without abnormality. Impression:           - The examined portion of the ileum was normal.                       - Three 6 to 8 mm polyps in the sigmoid colon and in                        the transverse colon, removed with a hot snare.                        Resected and retrieved.                       - The distal rectum and anal verge are normal on                        retroflexion view.                       - The examination was otherwise normal. Recommendation:       - Discharge patient to home.                       - Resume previous diet today.                       - Continue present medications.                       -  Await pathology results.                       - Repeat colonoscopy in 3 years for surveillance of                        multiple polyps. Procedure Code(s):    --- Professional ---                       [id number], Colonoscopy, flexible; with removal of tumor(s),                        polyp(s), or other lesion(s) by snare technique Diagnosis Code(s):    --- Professional ---                       Z12.11, Encounter for screening for malignant neoplasm                        of colon                       D12.5, Benign neoplasm of sigmoid colon                       D12.3, Benign neoplasm of transverse colon (hepatic                        flexure or splenic flexure) CPT copyright 2016 American Medical  Association. All rights reserved. The codes documented in this report are preliminary and upon coder review may  be revised to meet current compliance requirements. Dr. Ulyess Mort Lin Landsman MD, MD 06/14/2017 10:47:48 AM This report has been signed electronically. Number of Addenda: 0 Note Initiated On: 06/14/2017 10:23 AM Scope Withdrawal Time: 0 hours 10 minutes 14 seconds  Total Procedure Duration: 0 hours 14 minutes 34 seconds       [facility]

## 2017-06-14 NOTE — Transfer of Care (Signed)
Immediate Anesthesia Transfer of Care Note  Patient: Elizabeth Fuller  Procedure(s) Performed: COLONOSCOPY WITH PROPOFOL (N/A )  Patient Location: PACU  Anesthesia Type:General  Level of Consciousness: awake, alert  and oriented  Airway & Oxygen Therapy: Patient Spontanous Breathing and Patient connected to nasal cannula oxygen  Post-op Assessment: Report given to RN and Post -op Vital signs reviewed and stable  Post vital signs: Reviewed and stable  Last Vitals:  Vitals:   06/14/17 0952  BP: 127/89  Pulse: 70  Resp: 14  Temp: (!) 36.3 C  SpO2: 100%    Last Pain:  Vitals:   06/14/17 0952  TempSrc: Tympanic         Complications: No apparent anesthesia complications

## 2017-06-14 NOTE — Anesthesia Preprocedure Evaluation (Signed)
Anesthesia Evaluation  Patient identified by MRN, date of birth, ID band Patient awake    Reviewed: Allergy & Precautions, NPO status , Patient's Chart, lab work & pertinent test results  History of Anesthesia Complications Negative for: history of anesthetic complications  Airway Mallampati: II  TM Distance: <3 FB Neck ROM: Full    Dental no notable dental hx.    Pulmonary neg pulmonary ROS, neg sleep apnea, neg COPD,    breath sounds clear to auscultation- rhonchi (-) wheezing      Cardiovascular Exercise Tolerance: Good (-) hypertension(-) CAD, (-) Past MI, (-) Cardiac Stents and (-) CABG  Rhythm:Regular Rate:Normal - Systolic murmurs and - Diastolic murmurs    Neuro/Psych Anxiety negative neurological ROS     GI/Hepatic negative GI ROS, Neg liver ROS,   Endo/Other  neg diabetesHypothyroidism   Renal/GU negative Renal ROS     Musculoskeletal negative musculoskeletal ROS (+)   Abdominal (+) + obese,   Peds  Hematology negative hematology ROS (+)   Anesthesia Other Findings Past Medical History: No date: Anxiety No date: Hypothyroidism No date: Thyroid disease   Reproductive/Obstetrics                             Anesthesia Physical Anesthesia Plan  ASA: II  Anesthesia Plan: General   Post-op Pain Management:    Induction: Intravenous  PONV Risk Score and Plan: 2 and Propofol infusion  Airway Management Planned: Natural Airway  Additional Equipment:   Intra-op Plan:   Post-operative Plan:   Informed Consent: I have reviewed the patients History and Physical, chart, labs and discussed the procedure including the risks, benefits and alternatives for the proposed anesthesia with the patient or authorized representative who has indicated his/her understanding and acceptance.   Dental advisory given  Plan Discussed with: CRNA and Anesthesiologist  Anesthesia Plan  Comments:         Anesthesia Quick Evaluation

## 2017-06-14 NOTE — Anesthesia Postprocedure Evaluation (Signed)
Anesthesia Post Note  Patient: Elizabeth Fuller  Procedure(s) Performed: COLONOSCOPY WITH PROPOFOL (N/A )  Patient location during evaluation: Endoscopy Anesthesia Type: General Level of consciousness: awake and alert and oriented Pain management: pain level controlled Vital Signs Assessment: post-procedure vital signs reviewed and stable Respiratory status: spontaneous breathing, nonlabored ventilation and respiratory function stable Cardiovascular status: blood pressure returned to baseline and stable Postop Assessment: no signs of nausea or vomiting Anesthetic complications: no     Last Vitals:  Vitals:   06/14/17 1051 06/14/17 1111  BP: 97/69 119/75  Pulse:    Resp:    Temp:    SpO2:  100%    Last Pain:  Vitals:   06/14/17 1049  TempSrc: Tympanic                 [name]

## 2017-06-14 NOTE — Anesthesia Post-op Follow-up Note (Signed)
Anesthesia QCDR form completed.        

## 2017-06-15 ENCOUNTER — Encounter: Payer: Self-pay | Admitting: Gastroenterology

## 2017-06-15 LAB — SURGICAL PATHOLOGY

## 2017-06-16 ENCOUNTER — Encounter: Payer: Self-pay | Admitting: Gastroenterology

## 2017-07-05 DIAGNOSIS — M71572 Other bursitis, not elsewhere classified, left ankle and foot: Secondary | ICD-10-CM | POA: Diagnosis not present

## 2017-07-05 DIAGNOSIS — M67372 Transient synovitis, left ankle and foot: Secondary | ICD-10-CM | POA: Diagnosis not present

## 2017-07-05 DIAGNOSIS — M67371 Transient synovitis, right ankle and foot: Secondary | ICD-10-CM | POA: Diagnosis not present

## 2017-07-05 DIAGNOSIS — M19079 Primary osteoarthritis, unspecified ankle and foot: Secondary | ICD-10-CM | POA: Diagnosis not present

## 2017-07-05 DIAGNOSIS — M722 Plantar fascial fibromatosis: Secondary | ICD-10-CM | POA: Diagnosis not present

## 2017-07-05 DIAGNOSIS — M71571 Other bursitis, not elsewhere classified, right ankle and foot: Secondary | ICD-10-CM | POA: Diagnosis not present

## 2017-07-09 ENCOUNTER — Encounter: Payer: Self-pay | Admitting: Emergency Medicine

## 2017-07-09 ENCOUNTER — Encounter: Payer: Self-pay | Admitting: Family Medicine

## 2017-07-09 ENCOUNTER — Ambulatory Visit: Payer: BLUE CROSS/BLUE SHIELD | Admitting: Family Medicine

## 2017-07-09 VITALS — BP 118/70 | HR 66 | Temp 98.1°F | Resp 14 | Wt 184.0 lb

## 2017-07-09 DIAGNOSIS — F419 Anxiety disorder, unspecified: Secondary | ICD-10-CM | POA: Diagnosis not present

## 2017-07-09 DIAGNOSIS — G4700 Insomnia, unspecified: Secondary | ICD-10-CM

## 2017-07-09 MED ORDER — ESCITALOPRAM OXALATE 10 MG PO TABS: 10.0000 mg | ORAL_TABLET | Freq: Every day | ORAL | 3 refills | Status: DC

## 2017-07-09 MED ORDER — ZOLPIDEM TARTRATE 10 MG PO TABS: 5.0000 mg | ORAL_TABLET | Freq: Every day | ORAL | 3 refills | Status: DC

## 2017-07-09 NOTE — Progress Notes (Signed)
Patient: Elizabeth Fuller Female    DOB: October 03, 1965   52 y.o.   MRN: [id number] Visit Date: 07/09/2017  Today's Provider: Lavon Paganini, MD   Chief Complaint  Patient presents with  . Anxiety   Subjective:    HPI Pt is here today for a 1 month follow up of anxiety. She was started on Lexapro 10 mg daily. Pt reports that she is only taking the lexapro about 3 days a week. She has not had any side effects but she thinks she only wants to take the xanax as needed. She reports that she can not tell she is taking it because she is not taking it daily but she does not feel she needs it daily. She only takes the xanax a couple days a week and then it is only a half a tablet.     No Known Allergies   Current Outpatient Medications:  .  ALPRAZolam (XANAX XR) 0.5 MG 24 hr tablet, Take 0.5 mg by mouth daily as needed., Disp: , Rfl:  .  escitalopram (LEXAPRO) 10 MG tablet, Take 1 tablet (10 mg total) by mouth daily., Disp: 30 tablet, Rfl: 1 .  levothyroxine (SYNTHROID, LEVOTHROID) 75 MCG tablet, Take 1 tablet by mouth daily., Disp: , Rfl:  .  zolpidem (AMBIEN) 10 MG tablet, Take 5 mg by mouth at bedtime., Disp: , Rfl:   Review of Systems  Constitutional: Negative.   HENT: Negative.   Eyes: Negative.   Respiratory: Negative.   Cardiovascular: Negative.   Gastrointestinal: Negative.   Endocrine: Negative.   Genitourinary: Negative.   Musculoskeletal: Negative.   Skin: Negative.   Allergic/Immunologic: Negative.   Neurological: Negative.   Hematological: Negative.   Psychiatric/Behavioral: The patient is nervous/anxious.     Social History   Tobacco Use  . Smoking status: Never Smoker  . Smokeless tobacco: Never Used  Substance Use Topics  . Alcohol use: No    Comment: 1 drink every 6 months   Objective:   BP 118/70 (BP Location: Left Arm, Patient Position: Sitting, Cuff Size: Normal)   Pulse 66   Temp 98.1 F (36.7 C) (Oral)   Resp 14   Wt 184 lb (83.5 kg)   BMI  31.58 kg/m  Vitals:   07/09/17 0901  BP: 118/70  Pulse: 66  Resp: 14  Temp: 98.1 F (36.7 C)  TempSrc: Oral  Weight: 184 lb (83.5 kg)     Physical Exam  Constitutional: She is oriented to person, place, and time. She appears well-developed and well-nourished. No distress.  HENT:  Head: Normocephalic and atraumatic.  Eyes: Conjunctivae are normal. No scleral icterus.  Cardiovascular: Normal rate, regular rhythm, normal heart sounds and intact distal pulses.  No murmur heard. Pulmonary/Chest: Effort normal and breath sounds normal. No respiratory distress. She has no wheezes. She has no rales.  Musculoskeletal: She exhibits no edema.  Neurological: She is alert and oriented to person, place, and time.  Skin: Skin is warm and dry. Capillary refill takes less than 2 seconds. No rash noted.  Psychiatric: She has a normal mood and affect. Her behavior is normal.  Vitals reviewed.   GAD 7 : Generalized Anxiety Score 07/09/2017  Nervous, Anxious, on Edge 1  Control/stop worrying 1  Worry too much - different things 2  Trouble relaxing 1  Restless 0  Easily annoyed or irritable 1  Afraid - awful might happen 0  Total GAD 7 Score 6  Anxiety Difficulty Not difficult at  all     Depression screen [facility] 2/9 07/09/2017 06/01/2017  Decreased Interest 1 0  Down, Depressed, Hopeless 0 0  PHQ - 2 Score 1 0  Altered sleeping 0 -  Tired, decreased energy 1 -  Change in appetite 1 -  Feeling bad or failure about yourself  0 -  Trouble concentrating 0 -  Moving slowly or fidgety/restless 0 -  Suicidal thoughts 0 -  PHQ-9 Score 3 -  Difficult doing work/chores Not difficult at all -       Assessment & Plan:   Problem List Items Addressed This Visit      Other   Anxiety - Primary    Stable, but not taking SSRI consistently As discussed at last visit, would like to d/c benzos when stable on SSI Start Lexapro 10mg  dialy F/u in 2-3 months and consider dose titration      Relevant  Medications   escitalopram (LEXAPRO) 10 MG tablet   Insomnia    Refilled Ambien          Return in about 3 months (around 10/08/2017) for anxiety .   The entirety of the information documented in the History of Present Illness, Review of Systems and Physical Exam were personally obtained by me. Portions of this information were initially documented by [place], [place] and reviewed by me for thoroughness and accuracy.    Virginia Crews, MD, MPH [facility] 07/09/2017 10:05 AM

## 2017-07-09 NOTE — Assessment & Plan Note (Signed)
Stable, but not taking SSRI consistently As discussed at last visit, would like to d/c benzos when stable on SSI Start Lexapro 10mg  dialy F/u in 2-3 months and consider dose titration

## 2017-07-09 NOTE — Assessment & Plan Note (Signed)
Refilled Ambien 

## 2017-07-27 ENCOUNTER — Telehealth: Payer: Self-pay | Admitting: Family Medicine

## 2017-07-27 NOTE — Telephone Encounter (Signed)
Pt requesting refill of levothyroxine 75MCG sent to [facility] on [address]

## 2017-07-28 MED ORDER — LEVOTHYROXINE SODIUM 75 MCG PO TABS: 75.0000 ug | ORAL_TABLET | Freq: Every day | ORAL | 3 refills | Status: DC

## 2017-07-28 NOTE — Telephone Encounter (Signed)
Rx sent  Virginia Crews, MD, MPH [facility] 07/28/2017 8:24 AM

## 2017-11-18 ENCOUNTER — Other Ambulatory Visit: Payer: Self-pay | Admitting: Family Medicine

## 2018-03-11 ENCOUNTER — Other Ambulatory Visit: Payer: Self-pay | Admitting: Family Medicine

## 2018-03-11 NOTE — Telephone Encounter (Signed)
Was supposed to f/u in July.  Can we get her scheduled?

## 2018-03-11 NOTE — Telephone Encounter (Signed)
LMTCB to schedule a follow up appointment  

## 2018-03-14 DIAGNOSIS — M7751 Other enthesopathy of right foot: Secondary | ICD-10-CM | POA: Diagnosis not present

## 2018-03-14 DIAGNOSIS — M21621 Bunionette of right foot: Secondary | ICD-10-CM | POA: Diagnosis not present

## 2018-03-14 DIAGNOSIS — D179 Benign lipomatous neoplasm, unspecified: Secondary | ICD-10-CM | POA: Diagnosis not present

## 2018-03-14 DIAGNOSIS — G5790 Unspecified mononeuropathy of unspecified lower limb: Secondary | ICD-10-CM | POA: Diagnosis not present

## 2018-03-24 DIAGNOSIS — M7751 Other enthesopathy of right foot: Secondary | ICD-10-CM | POA: Diagnosis not present

## 2018-03-24 DIAGNOSIS — D179 Benign lipomatous neoplasm, unspecified: Secondary | ICD-10-CM | POA: Diagnosis not present

## 2018-04-07 ENCOUNTER — Encounter: Payer: Self-pay | Admitting: Family Medicine

## 2018-04-07 ENCOUNTER — Ambulatory Visit: Payer: BLUE CROSS/BLUE SHIELD | Admitting: Family Medicine

## 2018-04-07 VITALS — BP 103/70 | HR 76 | Temp 98.2°F | Resp 16 | Wt 184.4 lb

## 2018-04-07 DIAGNOSIS — G4700 Insomnia, unspecified: Secondary | ICD-10-CM | POA: Diagnosis not present

## 2018-04-07 DIAGNOSIS — E039 Hypothyroidism, unspecified: Secondary | ICD-10-CM | POA: Diagnosis not present

## 2018-04-07 DIAGNOSIS — F419 Anxiety disorder, unspecified: Secondary | ICD-10-CM

## 2018-04-07 MED ORDER — ESCITALOPRAM OXALATE 10 MG PO TABS: 10.0000 mg | ORAL_TABLET | Freq: Every day | ORAL | 3 refills | Status: DC

## 2018-04-07 NOTE — Assessment & Plan Note (Signed)
Well controlled now that she is taking Lexapro daily with good compliance No longer needing benzos Continue lexapro 10mg  daily

## 2018-04-07 NOTE — Assessment & Plan Note (Signed)
Last TSH well controlled Currently asymptomatic Continue synthroid at current dose pending labs Recheck TSH

## 2018-04-07 NOTE — Progress Notes (Signed)
Patient: Elizabeth Fuller Female    DOB: 1965/12/02   53 y.o.   MRN: [id number] Visit Date: 04/07/2018  Today's Provider: Lavon Paganini, MD   Chief Complaint  Patient presents with  . Anxiety   Subjective:     Anxiety  Presents for follow-up visit. Symptoms include nervous/anxious behavior. Patient reports no chest pain, compulsions, confusion, decreased concentration, depressed mood, dizziness, dry mouth, excessive worry, feeling of choking, hyperventilation, impotence, insomnia, irritability, malaise, muscle tension, nausea, obsessions, palpitations, panic, restlessness, shortness of breath or suicidal ideas. The severity of symptoms is mild. The quality of sleep is good. Nighttime awakenings: occasional.   Compliance with medications is 76-100%.   Stopped xanax and ambien. Anxiety is significantly better  GAD 7 : Generalized Anxiety Score 07/09/2017  Nervous, Anxious, on Edge 1  Control/stop worrying 1  Worry too much - different things 2  Trouble relaxing 1  Restless 0  Easily annoyed or irritable 1  Afraid - awful might happen 0  Total GAD 7 Score 6  Anxiety Difficulty Not difficult at all    Hypothyroidism: Doing well and asymptomatic.  Taking Synthroid 32mcg daily with good compliance  No Known Allergies   Current Outpatient Medications:  .  escitalopram (LEXAPRO) 10 MG tablet, Take 1 tablet (10 mg total) by mouth daily., Disp: 90 tablet, Rfl: 3 .  levothyroxine (SYNTHROID, LEVOTHROID) 75 MCG tablet, Take 1 tablet (75 mcg total) by mouth daily., Disp: 90 tablet, Rfl: 3  Review of Systems  Constitutional: Negative.  Negative for irritability.  HENT: Negative.   Eyes: Negative.   Respiratory: Negative.  Negative for shortness of breath.   Cardiovascular: Negative.  Negative for chest pain and palpitations.  Gastrointestinal: Negative.  Negative for nausea.  Endocrine: Negative.   Genitourinary: Negative.  Negative for impotence.  Musculoskeletal:  Negative.   Skin: Negative.   Allergic/Immunologic: Negative.   Neurological: Negative.  Negative for dizziness.  Hematological: Negative.   Psychiatric/Behavioral: Negative for agitation, behavioral problems, confusion, decreased concentration, sleep disturbance and suicidal ideas. The patient is nervous/anxious. The patient does not have insomnia.     Social History   Tobacco Use  . Smoking status: Never Smoker  . Smokeless tobacco: Never Used  Substance Use Topics  . Alcohol use: No    Comment: 1 drink every 6 months      Objective:   BP 103/70   Pulse 76   Temp 98.2 F (36.8 C) (Oral)   Resp 16   Wt 184 lb 6.4 oz (83.6 kg)   BMI 31.65 kg/m  Vitals:   04/07/18 0940  BP: 103/70  Pulse: 76  Resp: 16  Temp: 98.2 F (36.8 C)  TempSrc: Oral  Weight: 184 lb 6.4 oz (83.6 kg)     Physical Exam Vitals signs reviewed.  Constitutional:      General: She is not in acute distress.    Appearance: Normal appearance. She is well-developed. She is not diaphoretic.  HENT:     Head: Normocephalic and atraumatic.     Mouth/Throat:     Mouth: Mucous membranes are moist.     Pharynx: Oropharynx is clear.  Eyes:     General: No scleral icterus.    Conjunctiva/sclera: Conjunctivae normal.     Pupils: Pupils are equal, round, and reactive to light.  Neck:     Musculoskeletal: Neck supple.     Thyroid: No thyromegaly.  Cardiovascular:     Rate and Rhythm: Normal rate and regular  rhythm.     Pulses: Normal pulses.     Heart sounds: Normal heart sounds. No murmur.  Pulmonary:     Effort: Pulmonary effort is normal. No respiratory distress.     Breath sounds: Normal breath sounds. No wheezing or rales.  Abdominal:     General: There is no distension.     Palpations: Abdomen is soft.     Tenderness: There is no abdominal tenderness.  Musculoskeletal:     Right lower leg: No edema.     Left lower leg: No edema.  Lymphadenopathy:     Cervical: No cervical adenopathy.    Skin:    General: Skin is warm and dry.     Capillary Refill: Capillary refill takes less than 2 seconds.     Findings: No rash.  Neurological:     Mental Status: She is alert and oriented to person, place, and time. Mental status is at baseline.  Psychiatric:        Mood and Affect: Mood normal.        Behavior: Behavior normal.        Thought Content: Thought content normal.        Assessment & Plan   Problem List Items Addressed This Visit      Endocrine   Hypothyroidism - Primary    Last TSH well controlled Currently asymptomatic Continue synthroid at current dose pending labs Recheck TSH      Relevant Orders   TSH     Other   Anxiety    Well controlled now that she is taking Lexapro daily with good compliance No longer needing benzos Continue lexapro 10mg  daily      Relevant Medications   escitalopram (LEXAPRO) 10 MG tablet   Insomnia    Well controlled No longer needing Ambien Likely related to anxiety          Return in about 2 months (around 06/06/2018) for CPE.   The entirety of the information documented in the History of Present Illness, Review of Systems and Physical Exam were personally obtained by me. Portions of this information were initially documented by Tiburcio Pea and Hebert Soho, CMA and reviewed by me for thoroughness and accuracy.    Virginia Crews, MD, MPH [facility] 04/07/2018 12:50 PM

## 2018-04-07 NOTE — Assessment & Plan Note (Signed)
Well controlled No longer needing Ambien Likely related to anxiety

## 2018-04-08 ENCOUNTER — Telehealth: Payer: Self-pay

## 2018-04-08 LAB — TSH: TSH: 0.926 u[IU]/mL (ref 0.450–4.500)

## 2018-04-08 MED ORDER — LEVOTHYROXINE SODIUM 75 MCG PO TABS: 75.0000 ug | ORAL_TABLET | Freq: Every day | ORAL | 3 refills | Status: DC

## 2018-04-08 NOTE — Telephone Encounter (Signed)
Left patient a message advising her that labs are normal and refill [facility]

## 2018-04-08 NOTE — Telephone Encounter (Signed)
-----   Message from Virginia Crews, MD sent at 04/08/2018  8:03 AM EST ----- Normal labs. Ok to refill Synthroid at current dose #90 r3

## 2018-04-13 DIAGNOSIS — R202 Paresthesia of skin: Secondary | ICD-10-CM | POA: Diagnosis not present

## 2018-04-13 DIAGNOSIS — M5417 Radiculopathy, lumbosacral region: Secondary | ICD-10-CM | POA: Diagnosis not present

## 2018-05-13 DIAGNOSIS — Z1231 Encounter for screening mammogram for malignant neoplasm of breast: Secondary | ICD-10-CM | POA: Diagnosis not present

## 2018-06-06 ENCOUNTER — Encounter: Payer: Self-pay | Admitting: Family Medicine

## 2018-06-06 ENCOUNTER — Ambulatory Visit: Payer: BLUE CROSS/BLUE SHIELD | Admitting: Family Medicine

## 2018-06-06 VITALS — BP 114/79 | HR 87 | Temp 98.4°F | Wt 180.0 lb

## 2018-06-06 DIAGNOSIS — J069 Acute upper respiratory infection, unspecified: Secondary | ICD-10-CM | POA: Diagnosis not present

## 2018-06-06 NOTE — Patient Instructions (Signed)

## 2018-06-06 NOTE — Progress Notes (Signed)
Patient: Elizabeth Fuller Female    DOB: [birth year]   53 y.o.   MRN: [id number] Visit Date: 06/06/2018  Today's Provider: Lavon Paganini, MD   Chief Complaint  Patient presents with  . URI   Subjective:     URI   The current episode started 1 to 4 weeks ago. The maximum temperature recorded prior to her arrival was 100.4 - 100.9 F. The fever has been present for 3 to 4 days. Associated symptoms include congestion, coughing, sinus pain, sneezing and a sore throat. She has tried NSAIDs and decongestant for the symptoms. The treatment provided mild relief.    Fever has now resolved No symptoms 2 days ago and then sore throat turned into cough and congestion yesterday.  No Known Allergies   Current Outpatient Medications:  .  escitalopram (LEXAPRO) 10 MG tablet, Take 1 tablet (10 mg total) by mouth daily., Disp: 90 tablet, Rfl: 3 .  levothyroxine (SYNTHROID, LEVOTHROID) 75 MCG tablet, Take 1 tablet (75 mcg total) by mouth daily., Disp: 90 tablet, Rfl: 3  Review of Systems  Constitutional: Positive for fever.  HENT: Positive for congestion, sinus pressure, sinus pain, sneezing and sore throat.   Respiratory: Positive for cough.   Gastrointestinal: Negative.     Social History   Tobacco Use  . Smoking status: Never Smoker  . Smokeless tobacco: Never Used  Substance Use Topics  . Alcohol use: No    Comment: 1 drink every 6 months      Objective:   BP 114/79 (BP Location: Left Arm, Patient Position: Sitting, Cuff Size: Normal)   Pulse 87   Temp 98.4 F (36.9 C) (Oral)   Wt 180 lb (81.6 kg)   SpO2 97%   BMI 30.90 kg/m  Vitals:   06/06/18 0939  BP: 114/79  Pulse: 87  Temp: 98.4 F (36.9 C)  TempSrc: Oral  SpO2: 97%  Weight: 180 lb (81.6 kg)     Physical Exam Vitals signs reviewed.  Constitutional:      General: She is not in acute distress.    Appearance: Normal appearance. She is well-developed. She is not ill-appearing or diaphoretic.  HENT:   Head: Normocephalic and atraumatic.     Right Ear: Tympanic membrane, ear canal and external ear normal.     Left Ear: Tympanic membrane, ear canal and external ear normal.     Nose: Congestion present. No rhinorrhea.     Mouth/Throat:     Mouth: Mucous membranes are moist.     Pharynx: Oropharynx is clear. No oropharyngeal exudate or posterior oropharyngeal erythema.  Eyes:     General: No scleral icterus.    Conjunctiva/sclera: Conjunctivae normal.     Pupils: Pupils are equal, round, and reactive to light.  Neck:     Musculoskeletal: Neck supple.  Cardiovascular:     Rate and Rhythm: Normal rate and regular rhythm.     Pulses: Normal pulses.     Heart sounds: Normal heart sounds. No murmur.  Pulmonary:     Effort: Pulmonary effort is normal. No respiratory distress.     Breath sounds: Normal breath sounds. No wheezing or rhonchi.  Lymphadenopathy:     Cervical: No cervical adenopathy.  Skin:    General: Skin is warm and dry.     Capillary Refill: Capillary refill takes less than 2 seconds.     Findings: No rash.  Neurological:     Mental Status: She is alert and oriented to person,  place, and time. Mental status is at baseline.  Psychiatric:        Behavior: Behavior normal.         Assessment & Plan   1. Viral URI - symptoms and exam c/w viral URI - no evidence of strep pharyngitis, CAP, AOM, bacterial sinusitis, or other bacterial infection - discussed symptomatic management, natural course, and return precautions    Return if symptoms worsen or fail to improve.   The entirety of the information documented in the History of Present Illness, Review of Systems and Physical Exam were personally obtained by me. Portions of this information were initially documented by [facility], CMA and reviewed by me for thoroughness and accuracy.    Virginia Crews, MD, MPH [facility] 06/06/2018 9:53 AM

## 2018-06-19 IMAGING — CR DG LUMBAR SPINE COMPLETE 4+V
1 series · 5 of 5 positions shown · non-contrast
Comparison: None.

CLINICAL DATA: 51-year-old female with a history of left-sided
lumbar back pain

EXAM:
LUMBAR SPINE - COMPLETE 4+ VIEW

[Series 1: t lumbar spine ap · 0.14mm/px · 5 of 5 slices shown]
[im 1/5]
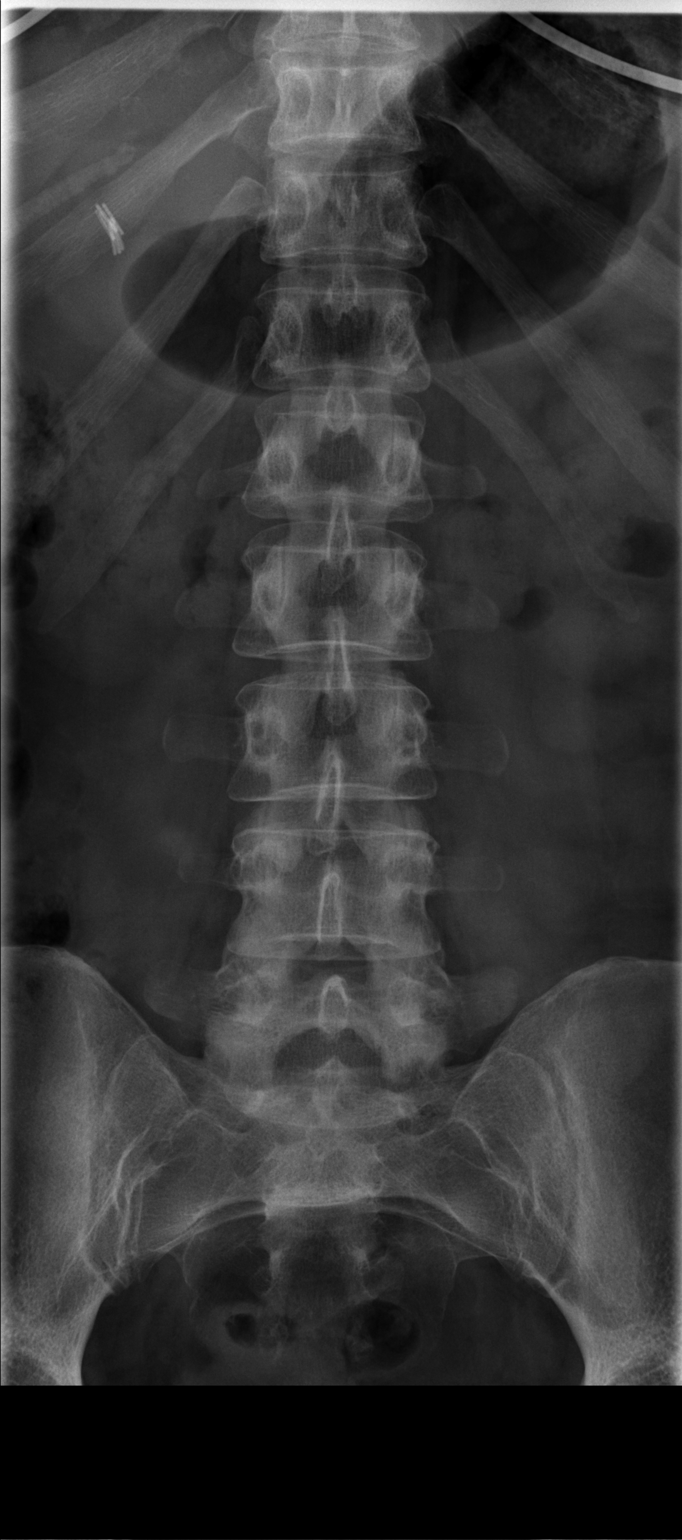
[im 2/5]
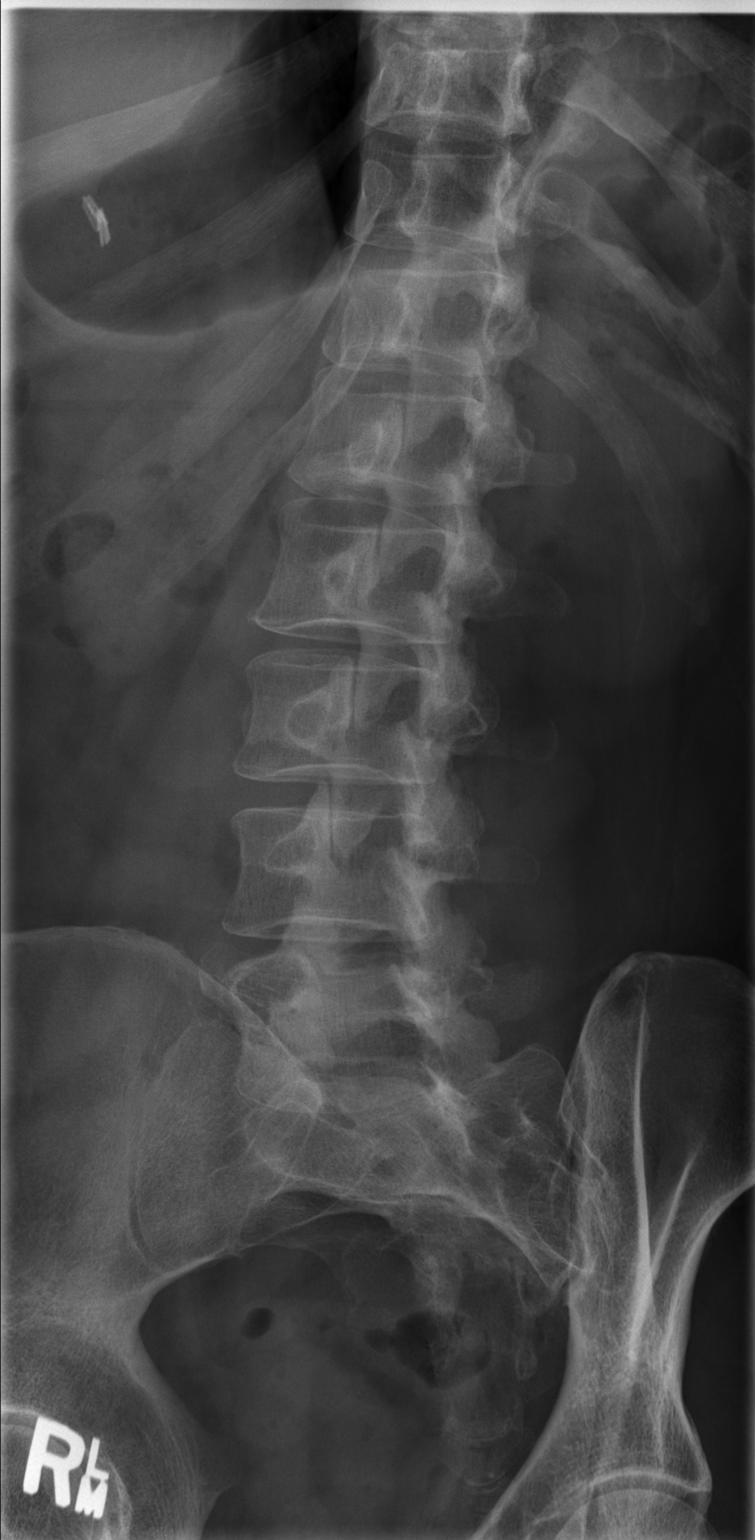
[im 3/5]
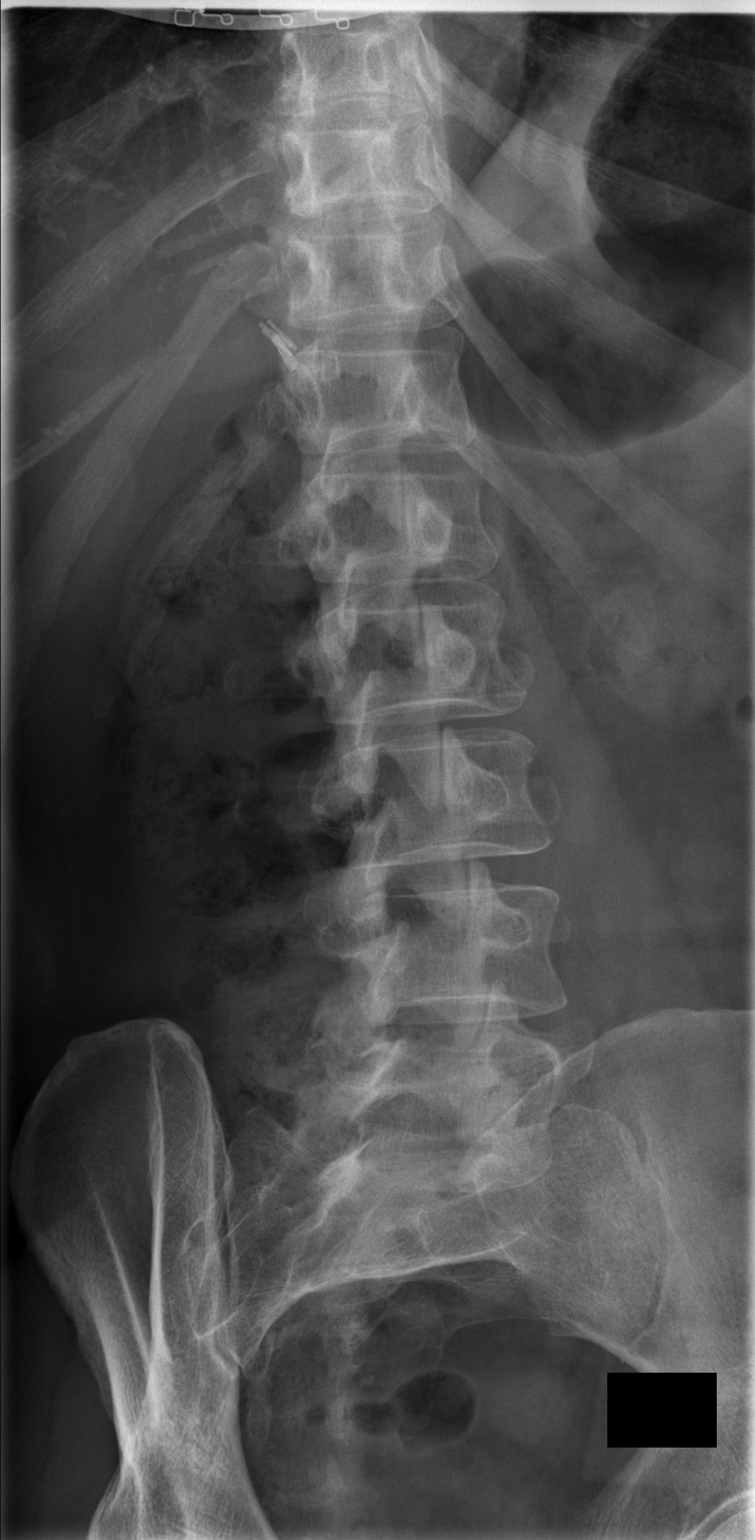
[im 4/5]
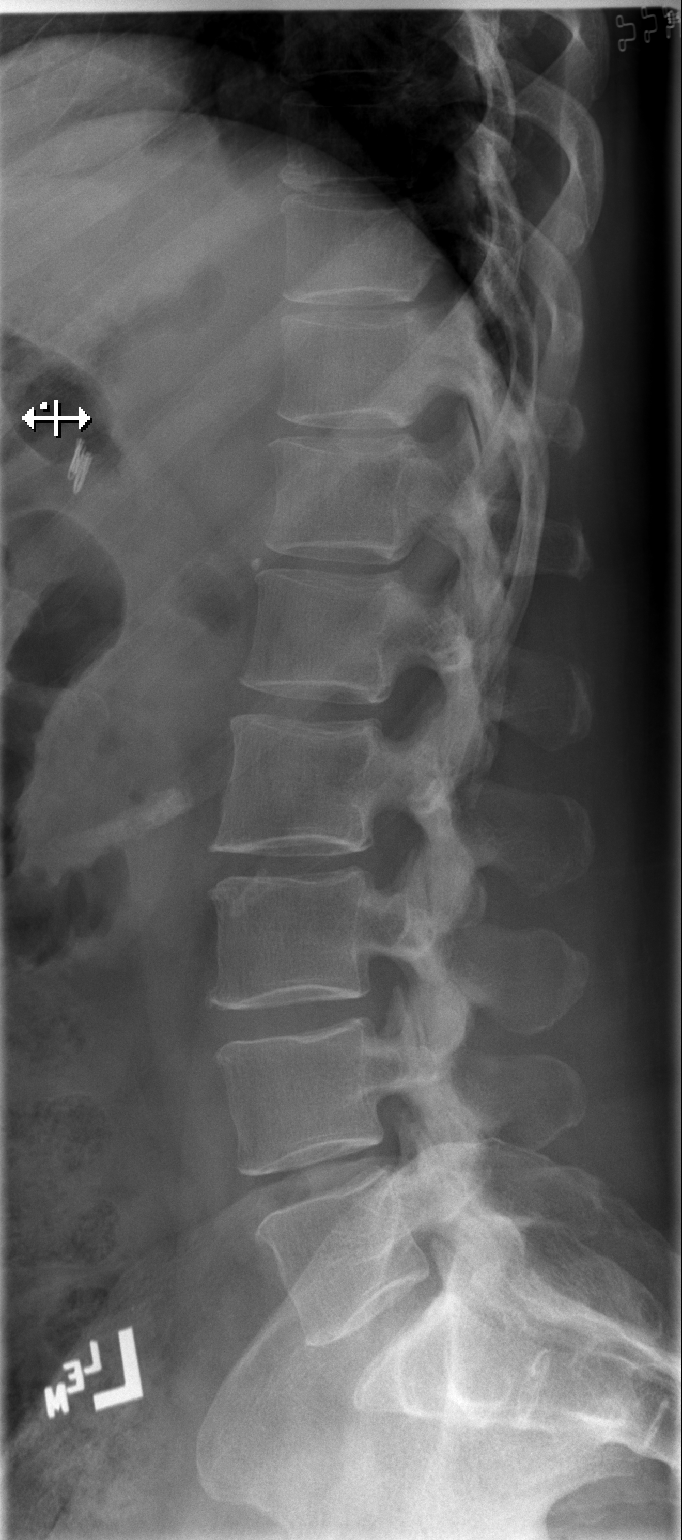
[im 5/5]
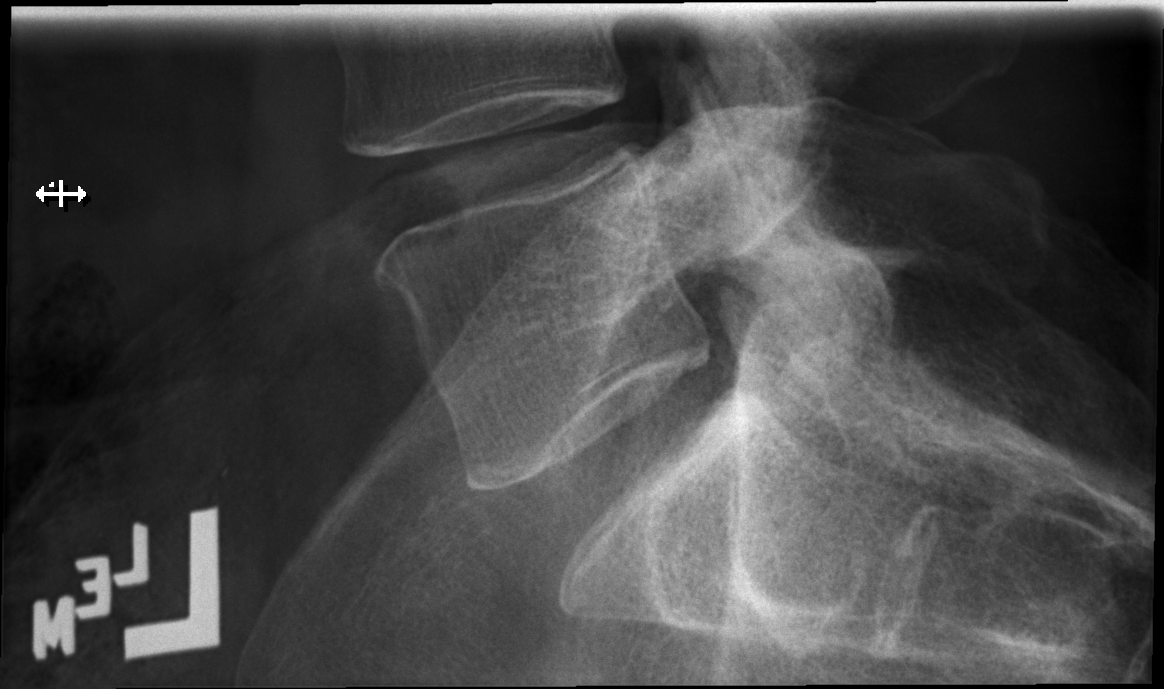

[5 of 5 positions shown; findings below may reference images not displayed]

FINDINGS: Lumbar Spine:

Lumbar vertebral elements maintain normal alignment without evidence
of anterolisthesis, retrolisthesis, subluxation.

No fracture line identified. Vertebral body heights maintained as
well as disc space heights.

No significant degenerative disc disease or endplate changes. No
significant facet changes.

Surgical changes of prior cholecystectomy.

Oblique images demonstrate no displaced pars defect
IMPRESSION: Negative for acute fracture or malalignment of the lumbar spine.

## 2018-07-07 ENCOUNTER — Encounter: Payer: BLUE CROSS/BLUE SHIELD | Admitting: Family Medicine

## 2018-11-02 ENCOUNTER — Telehealth: Payer: Self-pay | Admitting: Family Medicine

## 2018-11-02 NOTE — Telephone Encounter (Signed)
Appears that I already sent in 90 day supply, but Ok to talk to pharmacy and approval dispensing it that way

## 2018-11-02 NOTE — Telephone Encounter (Signed)
[facility]'s levothyroxine (SYNTHROID, LEVOTHROID) 75 MCG tablet.  Needing approval for this.  Pt asking for approval.  Thanks, [place]

## 2018-11-02 NOTE — Telephone Encounter (Signed)
[facility]. [facility] processed RX for a 90 day supply and will fill RX. [facility] when ready for pick up.

## 2018-11-23 ENCOUNTER — Encounter: Payer: BLUE CROSS/BLUE SHIELD | Admitting: Family Medicine

## 2019-01-20 ENCOUNTER — Encounter: Payer: BLUE CROSS/BLUE SHIELD | Admitting: Family Medicine

## 2019-02-14 ENCOUNTER — Encounter: Payer: BLUE CROSS/BLUE SHIELD | Admitting: Family Medicine

## 2019-04-26 ENCOUNTER — Other Ambulatory Visit: Payer: Self-pay

## 2019-04-26 ENCOUNTER — Ambulatory Visit (INDEPENDENT_AMBULATORY_CARE_PROVIDER_SITE_OTHER): Payer: BC Managed Care – PPO | Admitting: Family Medicine

## 2019-04-26 ENCOUNTER — Encounter: Payer: Self-pay | Admitting: Family Medicine

## 2019-04-26 VITALS — BP 122/82 | HR 75 | Temp 97.3°F | Ht 63.0 in | Wt 182.0 lb

## 2019-04-26 DIAGNOSIS — Z6831 Body mass index (BMI) 31.0-31.9, adult: Secondary | ICD-10-CM | POA: Insufficient documentation

## 2019-04-26 DIAGNOSIS — Z0000 Encounter for general adult medical examination without abnormal findings: Secondary | ICD-10-CM

## 2019-04-26 DIAGNOSIS — E66811 Obesity, class 1: Secondary | ICD-10-CM

## 2019-04-26 DIAGNOSIS — E669 Obesity, unspecified: Secondary | ICD-10-CM | POA: Diagnosis not present

## 2019-04-26 DIAGNOSIS — F419 Anxiety disorder, unspecified: Secondary | ICD-10-CM | POA: Diagnosis not present

## 2019-04-26 DIAGNOSIS — Z1231 Encounter for screening mammogram for malignant neoplasm of breast: Secondary | ICD-10-CM | POA: Diagnosis not present

## 2019-04-26 DIAGNOSIS — Z6832 Body mass index (BMI) 32.0-32.9, adult: Secondary | ICD-10-CM | POA: Diagnosis not present

## 2019-04-26 DIAGNOSIS — E039 Hypothyroidism, unspecified: Secondary | ICD-10-CM

## 2019-04-26 MED ORDER — ALPRAZOLAM 0.5 MG PO TABS: 0.5000 mg | ORAL_TABLET | Freq: Every evening | ORAL | 3 refills | Status: DC | PRN

## 2019-04-26 MED ORDER — LEVOTHYROXINE SODIUM 75 MCG PO TABS: 75.0000 ug | ORAL_TABLET | Freq: Every day | ORAL | 3 refills | Status: DC

## 2019-04-26 NOTE — Assessment & Plan Note (Signed)
Discussed importance of healthy weight management Discussed diet and exercise  

## 2019-04-26 NOTE — Progress Notes (Signed)
Patient: Elizabeth Fuller, Female    DOB: 10-03-1965, 54 y.o.   MRN: [id number] Visit Date: 04/26/2019  Today's Provider: Lavon Paganini, MD   Chief Complaint  Patient presents with  . Annual Exam   Subjective:     Annual physical exam Elizabeth Fuller is a 54 y.o. female who presents today for health maintenance and complete physical. She feels well. She reports not exercising. She reports she is sleeping well.  -----------------------------------------------------------------  Needs refills on Levothyroxine.  Taking with good compliance.  No side effects. Well controlled  Pt came off of Lexapro and would like to try a different medication.  She stopped in July (had some withdrawal symptoms).  She was having brain fog and concentration issues when on this.  Did better taking Xanax sparingly in the past.   Review of Systems  Constitutional: Negative.   HENT: Negative.   Eyes: Negative.   Respiratory: Negative.   Cardiovascular: Negative.   Gastrointestinal: Negative.   Endocrine: Negative.   Genitourinary: Negative.   Musculoskeletal: Negative.   Skin: Negative.   Allergic/Immunologic: Negative.   Neurological: Negative.   Hematological: Negative.   Psychiatric/Behavioral: Negative.     Social History      She  reports that she has never smoked. She has never used smokeless tobacco. She reports that she does not drink alcohol or use drugs.       Social History   Socioeconomic History  . Marital status: Married    Spouse name: Mortimer Fries  . Number of children: 0  . Years of education: 35  . Highest education level: 11th grade  Occupational History    Employer: [employer]  Tobacco Use  . Smoking status: Never Smoker  . Smokeless tobacco: Never Used  Substance and Sexual Activity  . Alcohol use: No    Comment: 1 drink every 6 months  . Drug use: No  . Sexual activity: Yes    Partners: Male    Birth control/protection: Post-menopausal  Other Topics Concern  . Not  on file  Social History Narrative  . Not on file   Social Determinants of Health   Financial Resource Strain:   . Difficulty of Paying Living Expenses: Not on file  Food Insecurity:   . Worried About Charity fundraiser in the Last Year: Not on file  . Ran Out of Food in the Last Year: Not on file  Transportation Needs:   . Lack of Transportation (Medical): Not on file  . Lack of Transportation (Non-Medical): Not on file  Physical Activity:   . Days of Exercise per Week: Not on file  . Minutes of Exercise per Session: Not on file  Stress:   . Feeling of Stress : Not on file  Social Connections:   . Frequency of Communication with Friends and Family: Not on file  . Frequency of Social Gatherings with Friends and Family: Not on file  . Attends Religious Services: Not on file  . Active Member of Clubs or Organizations: Not on file  . Attends Archivist Meetings: Not on file  . Marital Status: Not on file    Past Medical History:  Diagnosis Date  . Anxiety   . Hypothyroidism   . Thyroid disease      Patient Active Problem List   Diagnosis Date Noted  . Hypothyroidism 04/07/2018  . Special screening for malignant neoplasms, colon   . Anxiety 06/01/2017  . Insomnia 06/01/2017    Past Surgical History:  Procedure Laterality Date  . CHOLECYSTECTOMY    . COLONOSCOPY WITH PROPOFOL N/A 06/14/2017   Procedure: COLONOSCOPY WITH PROPOFOL;  Surgeon: Lin Landsman, MD;  Location: [facility] ENDOSCOPY;  Service: Gastroenterology;  Laterality: N/A;  . THYROIDECTOMY, PARTIAL Left     Family History        Family Status  Relation Name Status  . Mother  Alive  . Father  Deceased  . Brother  Alive  . Neg Hx  (Not Specified)        Her family history includes Alzheimer's disease in her mother; Diabetes in her father; Heart disease (age of onset: 65) in her brother; Heart disease (age of onset: 92) in her father; Hypertension in her brother and mother. There is no  history of Breast cancer, Colon cancer, Cervical cancer, or Ovarian cancer.      No Known Allergies   Current Outpatient Medications:  .  levothyroxine (SYNTHROID, LEVOTHROID) 75 MCG tablet, Take 1 tablet (75 mcg total) by mouth daily., Disp: 90 tablet, Rfl: 3 .  escitalopram (LEXAPRO) 10 MG tablet, Take 1 tablet (10 mg total) by mouth daily. (Patient not taking: Reported on 04/26/2019), Disp: 90 tablet, Rfl: 3   Patient Care Team: Virginia Crews, MD as PCP - General (Family Medicine)    Objective:    Vitals: BP 122/82 (BP Location: Left Arm, Patient Position: Sitting, Cuff Size: Large)   Pulse 75   Temp (!) 97.3 F (36.3 C) (Temporal)   Ht 5\' 3"  (1.6 m)   Wt 182 lb (82.6 kg)   SpO2 98%   BMI 32.24 kg/m    Vitals:   04/26/19 1403  BP: 122/82  Pulse: 75  Temp: (!) 97.3 F (36.3 C)  TempSrc: Temporal  SpO2: 98%  Weight: 182 lb (82.6 kg)  Height: 5\' 3"  (1.6 m)     Physical Exam Vitals reviewed.  Constitutional:      General: She is not in acute distress.    Appearance: Normal appearance. She is well-developed. She is not diaphoretic.  HENT:     Head: Normocephalic and atraumatic.     Right Ear: Tympanic membrane, ear canal and external ear normal.     Left Ear: Tympanic membrane, ear canal and external ear normal.  Eyes:     General: No scleral icterus.    Conjunctiva/sclera: Conjunctivae normal.     Pupils: Pupils are equal, round, and reactive to light.  Neck:     Thyroid: No thyromegaly.  Cardiovascular:     Rate and Rhythm: Normal rate and regular rhythm.     Heart sounds: Normal heart sounds. No murmur.  Pulmonary:     Effort: Pulmonary effort is normal. No respiratory distress.     Breath sounds: Normal breath sounds. No wheezing or rales.  Abdominal:     General: There is no distension.     Palpations: Abdomen is soft.     Tenderness: There is no abdominal tenderness. There is no guarding or rebound.  Genitourinary:    Comments: Breasts:  breasts appear normal, no suspicious masses, no skin or nipple changes or axillary nodes.  Musculoskeletal:        General: No deformity.     Cervical back: Neck supple.     Right lower leg: No edema.     Left lower leg: No edema.  Lymphadenopathy:     Cervical: No cervical adenopathy.  Skin:    General: Skin is warm and dry.     Capillary Refill:  Capillary refill takes less than 2 seconds.     Findings: No rash.  Neurological:     Mental Status: She is alert and oriented to person, place, and time. Mental status is at baseline.  Psychiatric:        Mood and Affect: Mood normal.        Behavior: Behavior normal.        Thought Content: Thought content normal.      Depression Screen PHQ 2/9 Scores 04/26/2019 08/16/2018 07/09/2017 06/01/2017  PHQ - 2 Score 0 0 1 0  PHQ- 9 Score 2 - 3 -       Assessment & Plan:     Routine Health Maintenance and Physical Exam  Exercise Activities and Dietary recommendations Goals   None     Immunization History  Administered Date(s) Administered  . Influenza,inj,Quad PF,6+ Mos 04/28/2017  . Tdap 04/28/2017    Health Maintenance  Topic Date Due  . MAMMOGRAM  05/19/2017  . INFLUENZA VACCINE  06/28/2019 (Originally 10/29/2018)  . PAP SMEAR-Modifier  06/01/2020  . TETANUS/TDAP  04/29/2027  . COLONOSCOPY  06/15/2027  . Hepatitis C Screening  Completed  . HIV Screening  Completed     Discussed health benefits of physical activity, and encouraged her to engage in regular exercise appropriate for her age and condition.    --------------------------------------------------------------------  Problem List Items Addressed This Visit      Endocrine   Hypothyroidism    Previously well controlled Continue Synthroid at current dose  Recheck TSH and adjust Synthroid as indicated        Relevant Medications   levothyroxine (SYNTHROID) 75 MCG tablet   Other Relevant Orders   TSH     Other   Anxiety    Did not tolerate Lexapro well -  has d/c'd Rare symptoms that are driven by stressors Can use Xanax sparingly Discussed habit-forming nature of benzos Discussed avoiding increasing number or strength of benzo Rx in the future      Relevant Medications   ALPRAZolam (XANAX) 0.5 MG tablet   Class 1 obesity without serious comorbidity with body mass index (BMI) of 32.0 to 32.9 in adult    Discussed importance of healthy weight management Discussed diet and exercise       Relevant Orders   TSH   Lipid panel   Comprehensive metabolic panel    Other Visit Diagnoses    Encounter for annual physical exam    -  Primary   Relevant Orders   TSH   Lipid panel   Comprehensive metabolic panel   Encounter for screening mammogram for malignant neoplasm of breast       Relevant Orders   MM 3D SCREEN BREAST BILATERAL       Return in about 1 year (around 04/25/2020) for CPE.   The entirety of the information documented in the History of Present Illness, Review of Systems and Physical Exam were personally obtained by me. Portions of this information were initially documented by Ashley Royalty, CMA and reviewed by me for thoroughness and accuracy.    Darline Faith, Dionne Bucy, MD MPH [facility]

## 2019-04-26 NOTE — Assessment & Plan Note (Signed)
Previously well controlled Continue Synthroid at current dose  Recheck TSH and adjust Synthroid as indicated   

## 2019-04-26 NOTE — Patient Instructions (Signed)

## 2019-04-26 NOTE — Assessment & Plan Note (Signed)
Did not tolerate Lexapro well - has d/c'd Rare symptoms that are driven by stressors Can use Xanax sparingly Discussed habit-forming nature of benzos Discussed avoiding increasing number or strength of benzo Rx in the future

## 2019-04-27 ENCOUNTER — Telehealth: Payer: Self-pay

## 2019-04-27 LAB — COMPREHENSIVE METABOLIC PANEL
ALT: 32 IU/L (ref 0–32)
AST: 29 IU/L (ref 0–40)
Albumin/Globulin Ratio: 2.2 (ref 1.2–2.2)
Albumin: 4.7 g/dL (ref 3.8–4.9)
Alkaline Phosphatase: 71 IU/L (ref 39–117)
BUN/Creatinine Ratio: 10 (ref 9–23)
BUN: 10 mg/dL (ref 6–24)
Bilirubin Total: 0.4 mg/dL (ref 0.0–1.2)
CO2: 26 mmol/L (ref 20–29)
Calcium: 9.7 mg/dL (ref 8.7–10.2)
Chloride: 102 mmol/L (ref 96–106)
Creatinine, Ser: 0.96 mg/dL (ref 0.57–1.00)
GFR calc Af Amer: 77 mL/min/{1.73_m2} (ref >59)
GFR calc non Af Amer: 67 mL/min/{1.73_m2} (ref >59)
Globulin, Total: 2.1 g/dL (ref 1.5–4.5)
Glucose: 72 mg/dL (ref 65–99)
Potassium: 4 mmol/L (ref 3.5–5.2)
Sodium: 140 mmol/L (ref 134–144)
Total Protein: 6.8 g/dL (ref 6.0–8.5)

## 2019-04-27 LAB — LIPID PANEL
Chol/HDL Ratio: 2.4 ratio (ref 0.0–4.4)
Cholesterol, Total: 183 mg/dL (ref 100–199)
HDL: 77 mg/dL (ref >39)
LDL Chol Calc (NIH): 90 mg/dL (ref 0–99)
Triglycerides: 86 mg/dL (ref 0–149)
VLDL Cholesterol Cal: 16 mg/dL (ref 5–40)

## 2019-04-27 LAB — TSH: TSH: 0.917 u[IU]/mL (ref 0.450–4.500)

## 2019-04-27 NOTE — Telephone Encounter (Signed)
-----   Message from Virginia Crews, MD sent at 04/27/2019  8:24 AM EST ----- Normal labs

## 2019-04-27 NOTE — Telephone Encounter (Signed)
Pt advised.   Thanks,   -Alvilda Mckenna  

## 2019-06-27 DIAGNOSIS — Z1231 Encounter for screening mammogram for malignant neoplasm of breast: Secondary | ICD-10-CM | POA: Diagnosis not present

## 2019-06-27 LAB — HM MAMMOGRAPHY

## 2019-07-05 ENCOUNTER — Encounter: Payer: Self-pay | Admitting: Family Medicine

## 2020-04-05 ENCOUNTER — Other Ambulatory Visit: Payer: Self-pay | Admitting: Family Medicine

## 2020-04-26 ENCOUNTER — Encounter: Payer: BC Managed Care – PPO | Admitting: Family Medicine

## 2020-05-30 ENCOUNTER — Other Ambulatory Visit: Payer: Self-pay

## 2020-05-30 ENCOUNTER — Ambulatory Visit (INDEPENDENT_AMBULATORY_CARE_PROVIDER_SITE_OTHER): Payer: BC Managed Care – PPO | Admitting: Family Medicine

## 2020-05-30 ENCOUNTER — Encounter: Payer: Self-pay | Admitting: Family Medicine

## 2020-05-30 VITALS — BP 115/82 | HR 81 | Temp 98.8°F | Resp 16 | Ht 64.0 in | Wt 184.0 lb

## 2020-05-30 DIAGNOSIS — E039 Hypothyroidism, unspecified: Secondary | ICD-10-CM

## 2020-05-30 DIAGNOSIS — E669 Obesity, unspecified: Secondary | ICD-10-CM

## 2020-05-30 DIAGNOSIS — Z0000 Encounter for general adult medical examination without abnormal findings: Secondary | ICD-10-CM

## 2020-05-30 DIAGNOSIS — Z6831 Body mass index (BMI) 31.0-31.9, adult: Secondary | ICD-10-CM | POA: Diagnosis not present

## 2020-05-30 DIAGNOSIS — Z1231 Encounter for screening mammogram for malignant neoplasm of breast: Secondary | ICD-10-CM

## 2020-05-30 DIAGNOSIS — Z23 Encounter for immunization: Secondary | ICD-10-CM

## 2020-05-30 DIAGNOSIS — G5711 Meralgia paresthetica, right lower limb: Secondary | ICD-10-CM | POA: Diagnosis not present

## 2020-05-30 DIAGNOSIS — F419 Anxiety disorder, unspecified: Secondary | ICD-10-CM | POA: Diagnosis not present

## 2020-05-30 MED ORDER — ALPRAZOLAM 0.5 MG PO TABS: 0.5000 mg | ORAL_TABLET | Freq: Every evening | ORAL | 3 refills | Status: DC | PRN

## 2020-05-30 MED ORDER — LEVOTHYROXINE SODIUM 75 MCG PO TABS: 75.0000 ug | ORAL_TABLET | Freq: Every day | ORAL | 3 refills | Status: DC

## 2020-05-30 NOTE — Progress Notes (Signed)
Complete physical exam   Patient: Elizabeth Fuller   DOB: 10/01/1965   55 y.o. Female  MRN: [id number] Visit Date: 05/30/2020  Today's healthcare provider: Lavon Paganini, MD   Chief Complaint  Patient presents with  . Annual Exam   Subjective    Elizabeth Fuller is a 55 y.o. female who presents today for a complete physical exam.  She reports consuming a general diet. The patient does not participate in regular exercise at present. She generally feels fairly well. She reports sleeping fairly well. She does have additional problems to discuss today.   HPI  Numbness: Patient complains of numbness in her right thigh for the past 4 months. Worse when walking around. Mostly at nighttime.  Past Medical History:  Diagnosis Date  . Anxiety   . Hypothyroidism   . Thyroid disease    Past Surgical History:  Procedure Laterality Date  . CHOLECYSTECTOMY    . COLONOSCOPY WITH PROPOFOL N/A 06/14/2017   Procedure: COLONOSCOPY WITH PROPOFOL;  Surgeon: Lin Landsman, MD;  Location: [facility] ENDOSCOPY;  Service: Gastroenterology;  Laterality: N/A;  . THYROIDECTOMY, PARTIAL Left    Social History   Socioeconomic History  . Marital status: Married    Spouse name: Mortimer Fries  . Number of children: 0  . Years of education: 15  . Highest education level: 11th grade  Occupational History    Employer: [employer]  Tobacco Use  . Smoking status: Never Smoker  . Smokeless tobacco: Never Used  Vaping Use  . Vaping Use: Never used  Substance and Sexual Activity  . Alcohol use: No    Comment: 1 drink every 6 months  . Drug use: No  . Sexual activity: Yes    Partners: Male    Birth control/protection: Post-menopausal  Other Topics Concern  . Not on file  Social History Narrative  . Not on file   Social Determinants of Health   Financial Resource Strain: Not on file  Food Insecurity: Not on file  Transportation Needs: Not on file  Physical Activity: Not on file  Stress: Not on file   Social Connections: Not on file  Intimate Partner Violence: Not on file   Family Status  Relation Name Status  . Mother  Alive  . Father  Deceased  . Brother  Alive  . Neg Hx  (Not Specified)   Family History  Problem Relation Age of Onset  . Hypertension Mother   . Alzheimer's disease Mother   . Diabetes Father   . Heart disease Father 28  . Heart disease Brother 30  . Hypertension Brother   . Breast cancer Neg Hx   . Colon cancer Neg Hx   . Cervical cancer Neg Hx   . Ovarian cancer Neg Hx    No Known Allergies  Patient Care Team: Virginia Crews, MD as PCP - General (Family Medicine)   Medications: Outpatient Medications Prior to Visit  Medication Sig  . [DISCONTINUED] ALPRAZolam (XANAX) 0.5 MG tablet Take 1 tablet (0.5 mg total) by mouth at bedtime as needed for anxiety.  . [DISCONTINUED] levothyroxine (SYNTHROID) 75 MCG tablet TAKE 1 TABLET BY MOUTH EVERY DAY   No facility-administered medications prior to visit.    Review of Systems  Constitutional: Negative.   HENT: Negative.   Eyes: Negative.   Respiratory: Negative.   Cardiovascular: Negative.   Gastrointestinal: Negative.   Endocrine: Negative.   Genitourinary: Negative.   Musculoskeletal: Negative.   Skin: Negative.   Allergic/Immunologic: Negative.  Neurological: Positive for numbness.  Hematological: Negative.   Psychiatric/Behavioral: Negative.       Objective    BP 115/82 (BP Location: Left Arm, Patient Position: Sitting, Cuff Size: Normal)   Pulse 81   Temp 98.8 F (37.1 C) (Temporal)   Resp 16   Ht 5' 4"  (1.941 m)   Wt 184 lb (83.5 kg)   BMI 31.58 kg/m    Physical Exam Vitals reviewed.  Constitutional:      General: She is not in acute distress.    Appearance: Normal appearance. She is well-developed. She is not diaphoretic.  HENT:     Head: Normocephalic and atraumatic.     Right Ear: Tympanic membrane, ear canal and external ear normal.     Left Ear: Tympanic  membrane, ear canal and external ear normal.     Nose: Nose normal.     Mouth/Throat:     Mouth: Mucous membranes are moist.     Pharynx: Oropharynx is clear. No oropharyngeal exudate.  Eyes:     General: No scleral icterus.    Conjunctiva/sclera: Conjunctivae normal.     Pupils: Pupils are equal, round, and reactive to light.  Neck:     Thyroid: No thyromegaly.  Cardiovascular:     Rate and Rhythm: Normal rate and regular rhythm.     Pulses: Normal pulses.     Heart sounds: Normal heart sounds. No murmur heard.   Pulmonary:     Effort: Pulmonary effort is normal. No respiratory distress.     Breath sounds: Normal breath sounds. No wheezing or rales.  Abdominal:     General: There is no distension.     Palpations: Abdomen is soft.     Tenderness: There is no abdominal tenderness.  Musculoskeletal:        General: No deformity.     Cervical back: Neck supple.     Right lower leg: No edema.     Left lower leg: No edema.  Lymphadenopathy:     Cervical: No cervical adenopathy.  Skin:    General: Skin is warm and dry.     Findings: No rash.  Neurological:     Mental Status: She is alert and oriented to person, place, and time. Mental status is at baseline.     Sensory: No sensory deficit.     Motor: No weakness.     Gait: Gait normal.  Psychiatric:        Mood and Affect: Mood normal.        Behavior: Behavior normal.        Thought Content: Thought content normal.       Last depression screening scores PHQ 2/9 Scores 05/30/2020 04/26/2019 08/16/2018  PHQ - 2 Score 2 0 0  PHQ- 9 Score 4 2 -   Last fall risk screening Fall Risk  05/30/2020  Falls in the past year? 0  Number falls in past yr: 0  Injury with Fall? 0  Follow up Falls evaluation completed   Last Audit-C alcohol use screening Alcohol Use Disorder Test (AUDIT) 04/26/2019  1. How often do you have a drink containing alcohol? 1  2. How many drinks containing alcohol do you have on a typical day when you are  drinking? 0  3. How often do you have six or more drinks on one occasion? 0  AUDIT-C Score 1   A score of 3 or more in women, and 4 or more in men indicates increased risk for alcohol abuse,  EXCEPT if all of the points are from question 1   Results for orders placed or performed in visit on 05/30/20  Comprehensive Metabolic Panel (CMET)  Result Value Ref Range   Glucose 88 65 - 99 mg/dL   BUN 11 6 - 24 mg/dL   Creatinine, Ser 0.80 0.57 - 1.00 mg/dL   eGFR 86 >59 mL/min/1.73   BUN/Creatinine Ratio 14 9 - 23   Sodium 142 134 - 144 mmol/L   Potassium 4.6 3.5 - 5.2 mmol/L   Chloride 102 96 - 106 mmol/L   CO2 25 20 - 29 mmol/L   Calcium 9.7 8.7 - 10.2 mg/dL   Total Protein 6.8 6.0 - 8.5 g/dL   Albumin 4.6 3.8 - 4.9 g/dL   Globulin, Total 2.2 1.5 - 4.5 g/dL   Albumin/Globulin Ratio 2.1 1.2 - 2.2   Bilirubin Total 0.3 0.0 - 1.2 mg/dL   Alkaline Phosphatase 80 44 - 121 IU/L   AST 19 0 - 40 IU/L   ALT 22 0 - 32 IU/L  Lipid panel  Result Value Ref Range   Cholesterol, Total 217 (H) 100 - 199 mg/dL   Triglycerides 147 0 - 149 mg/dL   HDL 75 >39 mg/dL   VLDL Cholesterol Cal 25 5 - 40 mg/dL   LDL Chol Calc (NIH) 117 (H) 0 - 99 mg/dL   Chol/HDL Ratio 2.9 0.0 - 4.4 ratio  TSH  Result Value Ref Range   TSH 1.610 0.450 - 4.500 uIU/mL    Assessment & Plan    Routine Health Maintenance and Physical Exam  Exercise Activities and Dietary recommendations Goals   None     Immunization History  Administered Date(s) Administered  . Influenza,inj,Quad PF,6+ Mos 04/28/2017  . PFIZER(Purple Top)SARS-COV-2 Vaccination 02/18/2020, 03/10/2020  . Tdap 04/28/2017  . Zoster Recombinat (Shingrix) 05/30/2020    Health Maintenance  Topic Date Due  . INFLUENZA VACCINE  06/27/2020 (Originally 10/29/2019)  . COVID-19 Vaccine (3 - Booster for Pfizer series) 09/08/2020  . MAMMOGRAM  06/26/2021  . PAP SMEAR-Modifier  06/02/2022  . TETANUS/TDAP  04/29/2027  . COLONOSCOPY (Pts 45-70yr Insurance  coverage will need to be confirmed)  06/15/2027  . Hepatitis C Screening  Completed  . HIV Screening  Completed  . HPV VACCINES  Aged Out    Discussed health benefits of physical activity, and encouraged her to engage in regular exercise appropriate for her age and condition.  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 Handout given      Relevant Medications   ALPRAZolam (XANAX) 0.5 MG tablet     Other   Anxiety    Chronic and fairly well controlled Did not tolerate Lexapro Rare symptoms that are driven by stressors Continue Xanax sparingly Have discussed the habit-forming nature of benzos and that we will avoid increasing number strength of benzo Rx in the future      Relevant Medications   ALPRAZolam (XANAX) 0.5 MG tablet   Class  1 obesity without serious comorbidity with body mass index (BMI) of 32.0 to 32.9 in adult    Discussed importance of healthy weight management Discussed diet and exercise        Other Visit Diagnoses    Encounter for annual physical exam    -  Primary   Relevant Orders   Comprehensive Metabolic Panel (CMET) (Completed)   Lipid panel (Completed)   TSH (Completed)   Screening mammogram for breast cancer       Relevant Orders   MM 3D SCREEN BREAST BILATERAL   Need for shingles vaccine       Relevant Orders   Varicella-zoster vaccine IM (Completed)       Return in about 1 year (around 05/30/2021) for CPE.     I, Lavon Paganini, MD, have  reviewed all documentation for this visit. The documentation on 05/31/20 for the exam, diagnosis, procedures, and orders are all accurate and complete.   Bacigalupo, Dionne Bucy, MD, MPH [facility]

## 2020-05-30 NOTE — Patient Instructions (Signed)
Preventive Care 57-57 Years Old, Female Preventive care refers to lifestyle choices and visits with your health care provider that can promote health and wellness. This includes:  A yearly physical exam. This is also called an annual wellness visit.  Regular dental and eye exams.  Immunizations.  Screening for certain conditions.  Healthy lifestyle choices, such as: ? Eating a healthy diet. ? Getting regular exercise. ? Not using drugs or products that contain nicotine and tobacco. ? Limiting alcohol use. What can I expect for my preventive care visit? Physical exam Your health care provider will check your:  Height and weight. These may be used to calculate your BMI (body mass index). BMI is a measurement that tells if you are at a healthy weight.  Heart rate and blood pressure.  Body temperature.  Skin for abnormal spots. Counseling Your health care provider may ask you questions about your:  Past medical problems.  Family's medical history.  Alcohol, tobacco, and drug use.  Emotional well-being.  Home life and relationship well-being.  Sexual activity.  Diet, exercise, and sleep habits.  Work and work Statistician.  Access to firearms.  Method of birth control.  Menstrual cycle.  Pregnancy history. What immunizations do I need? Vaccines are usually given at various ages, according to a schedule. Your health care provider will recommend vaccines for you based on your age, medical history, and lifestyle or other factors, such as travel or where you work.   What tests do I need? Blood tests  Lipid and cholesterol levels. These may be checked every 5 years, or more often if you are over 3 years old.  Hepatitis C test.  Hepatitis B test. Screening  Lung cancer screening. You may have this screening every year starting at age 73 if you have a 30-pack-year history of smoking and currently smoke or have quit within the past 15 years.  Colorectal cancer  screening. ? All adults should have this screening starting at age 52 and continuing until age 17. ? Your health care provider may recommend screening at age 49 if you are at increased risk. ? You will have tests every 1-10 years, depending on your results and the type of screening test.  Diabetes screening. ? This is done by checking your blood sugar (glucose) after you have not eaten for a while (fasting). ? You may have this done every 1-3 years.  Mammogram. ? This may be done every 1-2 years. ? Talk with your health care provider about when you should start having regular mammograms. This may depend on whether you have a family history of breast cancer.  BRCA-related cancer screening. This may be done if you have a family history of breast, ovarian, tubal, or peritoneal cancers.  Pelvic exam and Pap test. ? This may be done every 3 years starting at age 10. ? Starting at age 11, this may be done every 5 years if you have a Pap test in combination with an HPV test. Other tests  STD (sexually transmitted disease) testing, if you are at risk.  Bone density scan. This is done to screen for osteoporosis. You may have this scan if you are at high risk for osteoporosis. Talk with your health care provider about your test results, treatment options, and if necessary, the need for more tests. Follow these instructions at home: Eating and drinking  Eat a diet that includes fresh fruits and vegetables, whole grains, lean protein, and low-fat dairy products.  Take vitamin and mineral supplements  as recommended by your health care provider.  Do not drink alcohol if: ? Your health care provider tells you not to drink. ? You are pregnant, may be pregnant, or are planning to become pregnant.  If you drink alcohol: ? Limit how much you have to 0-1 drink a day. ? Be aware of how much alcohol is in your drink. In the [place] one drink equals one 12 oz bottle of beer (355 mL), one 5 oz glass of  wine (148 mL), or one 1 oz glass of hard liquor (44 mL).   Lifestyle  Take daily care of your teeth and gums. Brush your teeth every morning and night with fluoride toothpaste. Floss one time each day.  Stay active. Exercise for at least 30 minutes 5 or more days each week.  Do not use any products that contain nicotine or tobacco, such as cigarettes, e-cigarettes, and chewing tobacco. If you need help quitting, ask your health care provider.  Do not use drugs.  If you are sexually active, practice safe sex. Use a condom or other form of protection to prevent STIs (sexually transmitted infections).  If you do not wish to become pregnant, use a form of birth control. If you plan to become pregnant, see your health care provider for a prepregnancy visit.  If told by your health care provider, take low-dose aspirin daily starting at age 50.  Find healthy ways to cope with stress, such as: ? Meditation, yoga, or listening to music. ? Journaling. ? Talking to a trusted person. ? Spending time with friends and family. Safety  Always wear your seat belt while driving or riding in a vehicle.  Do not drive: ? If you have been drinking alcohol. Do not ride with someone who has been drinking. ? When you are tired or distracted. ? While texting.  Wear a helmet and other protective equipment during sports activities.  If you have firearms in your house, make sure you follow all gun safety procedures. What's next?  Visit your health care provider once a year for an annual wellness visit.  Ask your health care provider how often you should have your eyes and teeth checked.  Stay up to date on all vaccines. This information is not intended to replace advice given to you by your health care provider. Make sure you discuss any questions you have with your health care provider. Document Revised: 12/19/2019 Document Reviewed: 11/25/2017 Elsevier Patient Education  2021 Elsevier Inc.  

## 2020-05-31 ENCOUNTER — Telehealth: Payer: Self-pay | Admitting: *Deleted

## 2020-05-31 LAB — LIPID PANEL
Chol/HDL Ratio: 2.9 ratio (ref 0.0–4.4)
Cholesterol, Total: 217 mg/dL — ABNORMAL HIGH (ref 100–199)
HDL: 75 mg/dL (ref >39)
LDL Chol Calc (NIH): 117 mg/dL — ABNORMAL HIGH (ref 0–99)
Triglycerides: 147 mg/dL (ref 0–149)
VLDL Cholesterol Cal: 25 mg/dL (ref 5–40)

## 2020-05-31 LAB — COMPREHENSIVE METABOLIC PANEL
ALT: 22 IU/L (ref 0–32)
AST: 19 IU/L (ref 0–40)
Albumin/Globulin Ratio: 2.1 (ref 1.2–2.2)
Albumin: 4.6 g/dL (ref 3.8–4.9)
Alkaline Phosphatase: 80 IU/L (ref 44–121)
BUN/Creatinine Ratio: 14 (ref 9–23)
BUN: 11 mg/dL (ref 6–24)
Bilirubin Total: 0.3 mg/dL (ref 0.0–1.2)
CO2: 25 mmol/L (ref 20–29)
Calcium: 9.7 mg/dL (ref 8.7–10.2)
Chloride: 102 mmol/L (ref 96–106)
Creatinine, Ser: 0.8 mg/dL (ref 0.57–1.00)
Globulin, Total: 2.2 g/dL (ref 1.5–4.5)
Glucose: 88 mg/dL (ref 65–99)
Potassium: 4.6 mmol/L (ref 3.5–5.2)
Sodium: 142 mmol/L (ref 134–144)
Total Protein: 6.8 g/dL (ref 6.0–8.5)
eGFR: 86 mL/min/{1.73_m2} (ref >59)

## 2020-05-31 LAB — TSH: TSH: 1.61 u[IU]/mL (ref 0.450–4.500)

## 2020-05-31 NOTE — Assessment & Plan Note (Signed)
Chronic and fairly well controlled Did not tolerate Lexapro Rare symptoms that are driven by stressors Continue Xanax sparingly Have discussed the habit-forming nature of benzos and that we will avoid increasing number strength of benzo Rx in the future

## 2020-05-31 NOTE — Telephone Encounter (Signed)
Patient returned call and notified: Normal labs, except high cholesterol. The 10-year ASCVD (heart disease and stroke) risk score Elizabeth Bussing DC Jr., et al., 2013) is: 1.5% -which is low. No need for medications at this time, but I do recommend diet low in saturated fat and regular exercise - 30 min at least 5 times per week.

## 2020-05-31 NOTE — Assessment & Plan Note (Signed)
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 Handout given

## 2020-05-31 NOTE — Assessment & Plan Note (Signed)
Discussed importance of healthy weight management Discussed diet and exercise  

## 2020-05-31 NOTE — Assessment & Plan Note (Signed)
Previously well controlled Continue Synthroid at current dose  Recheck TSH and adjust Synthroid as indicated   

## 2020-06-03 ENCOUNTER — Telehealth: Payer: Self-pay

## 2020-06-03 NOTE — Telephone Encounter (Signed)
-----   Message from Virginia Crews, MD sent at 05/31/2020  9:21 AM EST ----- Normal labs, except high cholesterol. The 10-year ASCVD (heart disease and stroke) risk score Mikey Bussing DC Jr., et al., 2013) is: 1.5% -which is low.  No need for medications at this time, but I do recommend diet low in saturated fat and regular exercise - 30 min at least 5 times per week.

## 2020-06-03 NOTE — Telephone Encounter (Signed)
LMTCB 06/03/2020.  PEC please advise pt of lab results when she calls back.    Thanks,   -Mickel Baas

## 2020-06-04 NOTE — Telephone Encounter (Signed)
Patient advised by [facility] triage nurse.

## 2020-08-29 ENCOUNTER — Encounter: Payer: Self-pay | Admitting: Family Medicine

## 2020-08-29 ENCOUNTER — Other Ambulatory Visit: Payer: Self-pay

## 2020-08-29 ENCOUNTER — Ambulatory Visit (INDEPENDENT_AMBULATORY_CARE_PROVIDER_SITE_OTHER): Payer: BC Managed Care – PPO | Admitting: Family Medicine

## 2020-08-29 DIAGNOSIS — Z23 Encounter for immunization: Secondary | ICD-10-CM | POA: Diagnosis not present

## 2020-08-29 NOTE — Progress Notes (Signed)
Patient comes into office today to update immunization(s), patient reports that they feel well today and have no complaints or concerns to address. Immunization record has been reviewed with patient and information handout in regards to vaccine has been given. Patient was observed after injection and tolerated well with no adverse reaction or side effects.    Immunization History  Administered Date(s) Administered  . Influenza,inj,Quad PF,6+ Mos 04/28/2017  . PFIZER(Purple Top)SARS-COV-2 Vaccination 02/18/2020, 03/10/2020  . Tdap 04/28/2017  . Zoster Recombinat (Shingrix) 05/30/2020, 08/29/2020   Elizabeth Fuller.Elizabeth Fuller  Patient here for Shingrix vaccination only.  I did not examine the patient.  I did review his medical history, medications, and allergies and vaccine consent form.  CMA gave vaccination. Patient tolerated well.  Virginia Crews, MD, MPH [facility] 08/29/2020 4:33 PM

## 2021-01-15 ENCOUNTER — Telehealth: Payer: Self-pay

## 2021-01-15 NOTE — Telephone Encounter (Signed)
Copied from [place] [id number]. Topic: Medical Record Request - Other >> Jan 15, 2021 11:56 AM Oneta Rack wrote: Caller name:Tom  Relation to [employer]  Call back number: phone  [phone] fax # [phone]     Reason for call:  Caller checking on the status of medical release form faxed on 01/14/2021 to [phone]. Patient applied for high premium policy, caller requesting records for the past 5 years and would like request expedited. Caller will re fax again please note when received

## 2021-01-17 NOTE — Telephone Encounter (Signed)
Request received 01/16/21. It has been forwarded to [facility] to be processed. TNP

## 2021-03-15 DIAGNOSIS — H8112 Benign paroxysmal vertigo, left ear: Secondary | ICD-10-CM | POA: Diagnosis not present

## 2021-03-15 DIAGNOSIS — H6503 Acute serous otitis media, bilateral: Secondary | ICD-10-CM | POA: Diagnosis not present

## 2021-05-09 ENCOUNTER — Other Ambulatory Visit: Payer: Self-pay | Admitting: Family Medicine

## 2021-06-03 ENCOUNTER — Other Ambulatory Visit: Payer: Self-pay

## 2021-06-03 ENCOUNTER — Ambulatory Visit (INDEPENDENT_AMBULATORY_CARE_PROVIDER_SITE_OTHER): Payer: BC Managed Care – PPO | Admitting: Family Medicine

## 2021-06-03 ENCOUNTER — Encounter: Payer: Self-pay | Admitting: Family Medicine

## 2021-06-03 VITALS — BP 118/87 | HR 76 | Temp 98.0°F | Resp 16 | Ht 64.0 in | Wt 184.9 lb

## 2021-06-03 DIAGNOSIS — E669 Obesity, unspecified: Secondary | ICD-10-CM

## 2021-06-03 DIAGNOSIS — E039 Hypothyroidism, unspecified: Secondary | ICD-10-CM | POA: Diagnosis not present

## 2021-06-03 DIAGNOSIS — G4700 Insomnia, unspecified: Secondary | ICD-10-CM

## 2021-06-03 DIAGNOSIS — Z0000 Encounter for general adult medical examination without abnormal findings: Secondary | ICD-10-CM

## 2021-06-03 DIAGNOSIS — F419 Anxiety disorder, unspecified: Secondary | ICD-10-CM

## 2021-06-03 DIAGNOSIS — Z6831 Body mass index (BMI) 31.0-31.9, adult: Secondary | ICD-10-CM

## 2021-06-03 DIAGNOSIS — Z1231 Encounter for screening mammogram for malignant neoplasm of breast: Secondary | ICD-10-CM | POA: Diagnosis not present

## 2021-06-03 MED ORDER — ALPRAZOLAM 0.5 MG PO TABS: 0.5000 mg | ORAL_TABLET | Freq: Every evening | ORAL | 3 refills | Status: DC | PRN

## 2021-06-03 NOTE — Assessment & Plan Note (Signed)
Well-controlled ?No longer on Ambien ?Is using low-dose Xanax very sparingly as needed for sleep related to anxiety as below ?

## 2021-06-03 NOTE — Assessment & Plan Note (Signed)
Chronic and fairly well controlled ?Did not tolerate Lexapro ?Rare symptoms that are driven by stressors ?Continue low-dose Xanax sparingly ?We have discussed the habit-forming nature of benzos and that we will avoid increasing number strength of benzo Rx in the future ?

## 2021-06-03 NOTE — Assessment & Plan Note (Signed)
Discussed importance of healthy weight management Discussed diet and exercise  

## 2021-06-03 NOTE — Progress Notes (Signed)
I,Sulibeya S Dimas,acting as a Education administrator for Lavon Paganini, MD.,have documented all relevant documentation on the behalf of Lavon Paganini, MD,as directed by  Lavon Paganini, MD while in the presence of Lavon Paganini, MD.  Complete physical exam   Patient: Elizabeth Fuller   DOB: 14-Nov-1965   56 y.o. Female  MRN: [id number] Visit Date: 06/03/2021  Today's healthcare provider: Lavon Paganini, MD   Chief Complaint  Patient presents with   Annual Exam   Subjective    Elizabeth Fuller is a 56 y.o. female who presents today for a complete physical exam.  She reports consuming a general diet. The patient does not participate in regular exercise at present. She generally feels well. She reports sleeping well. She does not have additional problems to discuss today.  HPI    Past Medical History:  Diagnosis Date   Anxiety    Hypothyroidism    Thyroid disease    Past Surgical History:  Procedure Laterality Date   CHOLECYSTECTOMY     COLONOSCOPY WITH PROPOFOL N/A 06/14/2017   Procedure: COLONOSCOPY WITH PROPOFOL;  Surgeon: Lin Landsman, MD;  Location: [facility] ENDOSCOPY;  Service: Gastroenterology;  Laterality: N/A;   THYROIDECTOMY, PARTIAL Left    Social History   Socioeconomic History   Marital status: Married    Spouse name: Mortimer Fries   Number of children: 0   Years of education: 11   Highest education level: 11th grade  Occupational History    Employer: [employer]  Tobacco Use   Smoking status: Never   Smokeless tobacco: Never  Vaping Use   Vaping Use: Never used  Substance and Sexual Activity   Alcohol use: No    Comment: 1 drink every 6 months   Drug use: No   Sexual activity: Yes    Partners: Male    Birth control/protection: Post-menopausal  Other Topics Concern   Not on file  Social History Narrative   Not on file   Social Determinants of Health   Financial Resource Strain: Not on file  Food Insecurity: Not on file  Transportation Needs: Not on  file  Physical Activity: Not on file  Stress: Not on file  Social Connections: Not on file  Intimate Partner Violence: Not on file   Family Status  Relation Name Status   Mother  Alive   Father  Deceased   Brother  Alive   Neg Hx  (Not Specified)   Family History  Problem Relation Age of Onset   Hypertension Mother    Alzheimer's disease Mother    Diabetes Father    Heart disease Father 55   Heart disease Brother 39   Hypertension Brother    Breast cancer Neg Hx    Colon cancer Neg Hx    Cervical cancer Neg Hx    Ovarian cancer Neg Hx    No Known Allergies  Patient Care Team: Virginia Crews, MD as PCP - General (Family Medicine)   Medications: Outpatient Medications Prior to Visit  Medication Sig   fluticasone (FLONASE) 50 MCG/ACT nasal spray Place 2 sprays into both nostrils daily.   levothyroxine (SYNTHROID) 75 MCG tablet TAKE 1 TABLET BY MOUTH EVERY DAY   loratadine (CLARITIN) 10 MG tablet Take 10 mg by mouth daily.   [DISCONTINUED] ALPRAZolam (XANAX) 0.5 MG tablet Take 1 tablet (0.5 mg total) by mouth at bedtime as needed for anxiety.   No facility-administered medications prior to visit.    Review of Systems  All other systems reviewed  and are negative.  Last metabolic panel Lab Results  Component Value Date   GLUCOSE 88 05/30/2020   NA 142 05/30/2020   K 4.6 05/30/2020   CL 102 05/30/2020   CO2 25 05/30/2020   BUN 11 05/30/2020   CREATININE 0.80 05/30/2020   EGFR 86 05/30/2020   CALCIUM 9.7 05/30/2020   PROT 6.8 05/30/2020   ALBUMIN 4.6 05/30/2020   LABGLOB 2.2 05/30/2020   AGRATIO 2.1 05/30/2020   BILITOT 0.3 05/30/2020   ALKPHOS 80 05/30/2020   AST 19 05/30/2020   ALT 22 05/30/2020   Last lipids Lab Results  Component Value Date   CHOL 217 (H) 05/30/2020   HDL 75 05/30/2020   LDLCALC 117 (H) 05/30/2020   TRIG 147 05/30/2020   CHOLHDL 2.9 05/30/2020   Last thyroid functions Lab Results  Component Value Date   TSH 1.610  05/30/2020      Objective    BP 118/87 (BP Location: Left Arm, Patient Position: Sitting, Cuff Size: Large)    Pulse 76    Temp 98 F (36.7 C) (Temporal)    Resp 16    Ht _0  (1.626 m)    Wt 184 lb 14.4 oz (83.9 kg)    BMI 31.74 kg/m  BP Readings from Last 3 Encounters:  06/03/21 118/87  05/30/20 115/82  04/26/19 122/82   Wt Readings from Last 3 Encounters:  06/03/21 184 lb 14.4 oz (83.9 kg)  05/30/20 184 lb (83.5 kg)  04/26/19 182 lb (82.6 kg)       Physical Exam Vitals reviewed.  Constitutional:      General: She is not in acute distress.    Appearance: Normal appearance. She is well-developed. She is not diaphoretic.  HENT:     Head: Normocephalic and atraumatic.     Right Ear: Tympanic membrane, ear canal and external ear normal.     Left Ear: Tympanic membrane, ear canal and external ear normal.     Nose: Nose normal.     Mouth/Throat:     Mouth: Mucous membranes are moist.     Pharynx: Oropharynx is clear. No oropharyngeal exudate.  Eyes:     General: No scleral icterus.    Conjunctiva/sclera: Conjunctivae normal.     Pupils: Pupils are equal, round, and reactive to light.  Neck:     Thyroid: No thyromegaly.  Cardiovascular:     Rate and Rhythm: Normal rate and regular rhythm.     Pulses: Normal pulses.     Heart sounds: Normal heart sounds. No murmur heard. Pulmonary:     Effort: Pulmonary effort is normal. No respiratory distress.     Breath sounds: Normal breath sounds. No wheezing or rales.  Abdominal:     General: There is no distension.     Palpations: Abdomen is soft.     Tenderness: There is no abdominal tenderness.  Musculoskeletal:        General: No deformity.     Cervical back: Neck supple.     Right lower leg: No edema.     Left lower leg: No edema.  Lymphadenopathy:     Cervical: No cervical adenopathy.  Skin:    General: Skin is warm and dry.     Findings: No rash.  Neurological:     Mental Status: She is alert and oriented to  person, place, and time. Mental status is at baseline.     Gait: Gait normal.  Psychiatric:        Mood  and Affect: Mood normal.        Behavior: Behavior normal.        Thought Content: Thought content normal.      Last depression screening scores PHQ 2/9 Scores 06/03/2021 05/30/2020 04/26/2019  PHQ - 2 Score 0 2 0  PHQ- 9 Score 0 4 2   Last fall risk screening Fall Risk  06/03/2021  Falls in the past year? 0  Number falls in past yr: 0  Injury with Fall? 0  Risk for fall due to : No Fall Risks  Follow up Falls evaluation completed   Last Audit-C alcohol use screening Alcohol Use Disorder Test (AUDIT) 06/03/2021  1. How often do you have a drink containing alcohol? 1  2. How many drinks containing alcohol do you have on a typical day when you are drinking? 0  3. How often do you have six or more drinks on one occasion? 0  AUDIT-C Score 1   A score of 3 or more in women, and 4 or more in men indicates increased risk for alcohol abuse, EXCEPT if all of the points are from question 1   No results found for any visits on 06/03/21.  Assessment & Plan    Routine Health Maintenance and Physical Exam  Exercise Activities and Dietary recommendations  Goals   None     Immunization History  Administered Date(s) Administered   Influenza,inj,Quad PF,6+ Mos 04/28/2017   PFIZER(Purple Top)SARS-COV-2 Vaccination 02/18/2020, 03/10/2020   Tdap 04/28/2017   Zoster Recombinat (Shingrix) 05/30/2020, 08/29/2020    Health Maintenance  Topic Date Due   COVID-19 Vaccine (3 - Booster for Pfizer series) 05/05/2020   INFLUENZA VACCINE  06/27/2021 (Originally 10/28/2020)   MAMMOGRAM  06/26/2021   PAP SMEAR-Modifier  06/02/2022   TETANUS/TDAP  04/29/2027   COLONOSCOPY (Pts 45-38yrs Insurance coverage will need to be confirmed)  06/15/2027   Hepatitis C Screening  Completed   HIV Screening  Completed   Zoster Vaccines- Shingrix  Completed   HPV VACCINES  Aged Out    Discussed health benefits  of physical activity, and encouraged her to engage in regular exercise appropriate for her age and condition.  Problem List Items Addressed This Visit       Endocrine   Hypothyroidism    Previously well controlled Continue Synthroid at current dose  Recheck TSH and adjust Synthroid as indicated        Relevant Orders   TSH     Other   Anxiety    Chronic and fairly well controlled Did not tolerate Lexapro Rare symptoms that are driven by stressors Continue low-dose Xanax sparingly We have discussed the habit-forming nature of benzos and that we will avoid increasing number strength of benzo Rx in the future      Relevant Medications   ALPRAZolam (XANAX) 0.5 MG tablet   Insomnia    Well-controlled No longer on Ambien Is using low-dose Xanax very sparingly as needed for sleep related to anxiety as below      Class 1 obesity without serious comorbidity with body mass index (BMI) of 31.0 to 31.9 in adult    Discussed importance of healthy weight management Discussed diet and exercise       Relevant Orders   Lipid panel   Comprehensive metabolic panel   Other Visit Diagnoses     Encounter for annual physical exam    -  Primary   Relevant Orders   MM 3D SCREEN BREAST BILATERAL   Lipid  panel   Comprehensive metabolic panel   TSH   Breast cancer screening by mammogram       Relevant Orders   MM 3D SCREEN BREAST BILATERAL        Return in about 1 year (around 06/04/2022) for CPE.     I, Lavon Paganini, MD, have reviewed all documentation for this visit. The documentation on 06/03/21 for the exam, diagnosis, procedures, and orders are all accurate and complete.   Mattie Nordell, Dionne Bucy, MD, MPH [facility]

## 2021-06-03 NOTE — Assessment & Plan Note (Signed)
Previously well controlled Continue Synthroid at current dose  Recheck TSH and adjust Synthroid as indicated   

## 2021-06-04 LAB — COMPREHENSIVE METABOLIC PANEL
ALT: 30 IU/L (ref 0–32)
AST: 27 IU/L (ref 0–40)
Albumin/Globulin Ratio: 2.4 — ABNORMAL HIGH (ref 1.2–2.2)
Albumin: 4.7 g/dL (ref 3.8–4.9)
Alkaline Phosphatase: 80 IU/L (ref 44–121)
BUN/Creatinine Ratio: 11 (ref 9–23)
BUN: 11 mg/dL (ref 6–24)
Bilirubin Total: 0.6 mg/dL (ref 0.0–1.2)
CO2: 27 mmol/L (ref 20–29)
Calcium: 9.5 mg/dL (ref 8.7–10.2)
Chloride: 101 mmol/L (ref 96–106)
Creatinine, Ser: 0.98 mg/dL (ref 0.57–1.00)
Globulin, Total: 2 g/dL (ref 1.5–4.5)
Glucose: 92 mg/dL (ref 70–99)
Potassium: 4.8 mmol/L (ref 3.5–5.2)
Sodium: 140 mmol/L (ref 134–144)
Total Protein: 6.7 g/dL (ref 6.0–8.5)
eGFR: 67 mL/min/{1.73_m2} (ref >59)

## 2021-06-04 LAB — TSH: TSH: 1.5 u[IU]/mL (ref 0.450–4.500)

## 2021-06-04 LAB — LIPID PANEL
Chol/HDL Ratio: 3 ratio (ref 0.0–4.4)
Cholesterol, Total: 209 mg/dL — ABNORMAL HIGH (ref 100–199)
HDL: 69 mg/dL (ref >39)
LDL Chol Calc (NIH): 126 mg/dL — ABNORMAL HIGH (ref 0–99)
Triglycerides: 76 mg/dL (ref 0–149)
VLDL Cholesterol Cal: 14 mg/dL (ref 5–40)

## 2021-06-10 ENCOUNTER — Other Ambulatory Visit: Payer: Self-pay | Admitting: Family Medicine

## 2021-06-10 NOTE — Telephone Encounter (Signed)
Requested Prescriptions  ?Pending Prescriptions Disp Refills  ?? levothyroxine (SYNTHROID) 75 MCG tablet [Pharmacy Med Name: LEVOTHYROXINE 75 MCG TABLET] 30 tablet 1  ?  Sig: TAKE 1 TABLET BY MOUTH EVERY DAY  ?  ? Endocrinology:  Hypothyroid Agents Passed - 06/10/2021 11:36 AM  ?  ?  Passed - TSH in normal range and within 360 days  ?  TSH  ?Date Value Ref Range Status  ?06/03/2021 1.500 0.450 - 4.500 uIU/mL Final  ?   ?  ?  Passed - Valid encounter within last 12 months  ?  Recent Outpatient Visits   ?      ? 1 week ago Encounter for annual physical exam  ? [facility] [place], Dionne Bucy, MD  ? 9 months ago Need for shingles vaccine  ? [facility] [place], Dionne Bucy, MD  ? 1 year ago Encounter for annual physical exam  ? [facility], Dionne Bucy, MD  ? 2 years ago Encounter for annual physical exam  ? [facility] [place], Dionne Bucy, MD  ? 3 years ago Viral URI  ? [facility] [place], Dionne Bucy, MD  ?  ?  ? ?  ?  ?  ? ?

## 2021-06-26 DIAGNOSIS — Z1231 Encounter for screening mammogram for malignant neoplasm of breast: Secondary | ICD-10-CM | POA: Diagnosis not present

## 2021-06-26 DIAGNOSIS — Z1239 Encounter for other screening for malignant neoplasm of breast: Secondary | ICD-10-CM | POA: Diagnosis not present

## 2021-06-26 LAB — HM MAMMOGRAPHY

## 2021-07-11 DIAGNOSIS — R112 Nausea with vomiting, unspecified: Secondary | ICD-10-CM | POA: Diagnosis not present

## 2021-07-11 DIAGNOSIS — J029 Acute pharyngitis, unspecified: Secondary | ICD-10-CM | POA: Diagnosis not present

## 2021-07-11 DIAGNOSIS — R197 Diarrhea, unspecified: Secondary | ICD-10-CM | POA: Diagnosis not present

## 2022-02-23 ENCOUNTER — Other Ambulatory Visit: Payer: Self-pay | Admitting: Family Medicine

## 2022-02-23 MED ORDER — ALPRAZOLAM 0.5 MG PO TABS: 0.5000 mg | ORAL_TABLET | Freq: Every evening | ORAL | 3 refills | Status: AC | PRN

## 2022-02-23 NOTE — Telephone Encounter (Signed)
[facility] pharmacy faxed refill request for the following medications:   ALPRAZolam (XANAX) 0.5 MG tablet   Please advise

## 2022-05-27 DIAGNOSIS — J069 Acute upper respiratory infection, unspecified: Secondary | ICD-10-CM | POA: Diagnosis not present

## 2022-05-27 DIAGNOSIS — R051 Acute cough: Secondary | ICD-10-CM | POA: Diagnosis not present

## 2022-06-21 ENCOUNTER — Other Ambulatory Visit: Payer: Self-pay | Admitting: Family Medicine

## 2022-06-22 NOTE — Telephone Encounter (Signed)
Requested medications are due for refill today.  yes  Requested medications are on the active medications list.  yes  Last refill. 06/10/2021 #90 3 rf  Future visit scheduled.   no  Notes to clinic.  Labs are expired.    Requested Prescriptions  Pending Prescriptions Disp Refills   levothyroxine (SYNTHROID) 75 MCG tablet [Pharmacy Med Name: LEVOTHYROXINE 75 MCG TABLET] 30 tablet 11    Sig: TAKE 1 TABLET BY MOUTH EVERY DAY     Endocrinology:  Hypothyroid Agents Failed - 06/21/2022  1:09 AM      Failed - TSH in normal range and within 360 days    TSH  Date Value Ref Range Status  06/03/2021 1.500 0.450 - 4.500 uIU/mL Final         Failed - Valid encounter within last 12 months    Recent Outpatient Visits           1 year ago Encounter for annual physical exam   [facility] [place], Dionne Bucy, MD   1 year ago Need for shingles vaccine   [place], Dionne Bucy, MD   2 years ago Encounter for annual physical exam   [place] [facility], Dionne Bucy, MD   3 years ago Encounter for annual physical exam   [place] [facility] [place], Dionne Bucy, MD   4 years ago Viral URI   [facility] [place], Dionne Bucy, MD

## 2022-07-19 ENCOUNTER — Other Ambulatory Visit: Payer: Self-pay | Admitting: Family Medicine

## 2022-07-23 ENCOUNTER — Other Ambulatory Visit: Payer: Self-pay | Admitting: Family Medicine

## 2022-07-23 NOTE — Telephone Encounter (Signed)
Requested medications are due for refill today.  yes  Requested medications are on the active medications list.  yes  Last refill. 06/22/2022 #30 0 rf  Future visit scheduled.   no  Notes to clinic.  Labs are expired. Courtesy refill already given.    Requested Prescriptions  Pending Prescriptions Disp Refills   levothyroxine (SYNTHROID) 75 MCG tablet [Pharmacy Med Name: LEVOTHYROXINE 75 MCG TABLET] 30 tablet 0    Sig: TAKE 1 TABLET BY MOUTH EVERY DAY     Endocrinology:  Hypothyroid Agents Failed - 07/23/2022  1:43 AM      Failed - TSH in normal range and within 360 days    TSH  Date Value Ref Range Status  06/03/2021 1.500 0.450 - 4.500 uIU/mL Final         Failed - Valid encounter within last 12 months    Recent Outpatient Visits           1 year ago Encounter for annual physical exam   [facility] [place], Marzella Schlein, MD   1 year ago Need for shingles vaccine   [facility] [place], Marzella Schlein, MD   2 years ago Encounter for annual physical exam   [place] [facility] [place], Marzella Schlein, MD   3 years ago Encounter for annual physical exam   [place] [facility] [place], Marzella Schlein, MD   4 years ago Viral URI   [facility] [place], Marzella Schlein, MD

## 2022-09-21 DIAGNOSIS — G4700 Insomnia, unspecified: Secondary | ICD-10-CM | POA: Diagnosis not present

## 2022-09-21 DIAGNOSIS — Z833 Family history of diabetes mellitus: Secondary | ICD-10-CM | POA: Diagnosis not present

## 2022-09-21 DIAGNOSIS — Z79899 Other long term (current) drug therapy: Secondary | ICD-10-CM | POA: Diagnosis not present

## 2022-09-21 DIAGNOSIS — E039 Hypothyroidism, unspecified: Secondary | ICD-10-CM | POA: Diagnosis not present

## 2022-09-23 ENCOUNTER — Telehealth: Payer: Self-pay | Admitting: Family Medicine

## 2022-09-23 NOTE — Telephone Encounter (Signed)
Medical Record Release received on 09/23/2022 transfer of care -[facility]

## 2022-09-24 NOTE — Telephone Encounter (Signed)
This should be handled by front office/medical records, not come to nurse box or MD

## 2022-10-02 DIAGNOSIS — E039 Hypothyroidism, unspecified: Secondary | ICD-10-CM | POA: Diagnosis not present

## 2022-10-02 DIAGNOSIS — Z833 Family history of diabetes mellitus: Secondary | ICD-10-CM | POA: Diagnosis not present

## 2022-10-02 DIAGNOSIS — E785 Hyperlipidemia, unspecified: Secondary | ICD-10-CM | POA: Diagnosis not present

## 2023-02-05 DIAGNOSIS — R21 Rash and other nonspecific skin eruption: Secondary | ICD-10-CM | POA: Diagnosis not present

## 2023-02-05 DIAGNOSIS — L509 Urticaria, unspecified: Secondary | ICD-10-CM | POA: Diagnosis not present
